# Patient Record
Sex: Female | Born: 1940 | Race: White | Hispanic: No | Marital: Married | Smoking: Current every day smoker
Health system: Southern US, Community
[De-identification: ages and names within clinical notes are randomized; demographics above are authoritative.]

## PROBLEM LIST (undated history)

## (undated) DIAGNOSIS — I6529 Occlusion and stenosis of unspecified carotid artery: Secondary | ICD-10-CM

## (undated) DIAGNOSIS — K219 Gastro-esophageal reflux disease without esophagitis: Secondary | ICD-10-CM

## (undated) DIAGNOSIS — E785 Hyperlipidemia, unspecified: Secondary | ICD-10-CM

## (undated) DIAGNOSIS — G4700 Insomnia, unspecified: Secondary | ICD-10-CM

## (undated) DIAGNOSIS — F329 Major depressive disorder, single episode, unspecified: Secondary | ICD-10-CM

## (undated) DIAGNOSIS — Z8673 Personal history of transient ischemic attack (TIA), and cerebral infarction without residual deficits: Secondary | ICD-10-CM

## (undated) DIAGNOSIS — I10 Essential (primary) hypertension: Secondary | ICD-10-CM

## (undated) DIAGNOSIS — F32A Depression, unspecified: Secondary | ICD-10-CM

## (undated) DIAGNOSIS — I639 Cerebral infarction, unspecified: Secondary | ICD-10-CM

## (undated) HISTORY — PX: BACK SURGERY: SHX140

---

## 1975-05-07 HISTORY — PX: ABDOMINAL HYSTERECTOMY: SHX81

## 1995-05-07 HISTORY — PX: SHOULDER SURGERY: SHX246

## 2006-09-24 ENCOUNTER — Ambulatory Visit: Payer: Self-pay | Admitting: Internal Medicine

## 2007-04-10 ENCOUNTER — Ambulatory Visit: Payer: Self-pay | Admitting: Internal Medicine

## 2007-04-16 ENCOUNTER — Ambulatory Visit: Payer: Self-pay | Admitting: Internal Medicine

## 2011-05-11 ENCOUNTER — Ambulatory Visit: Payer: Self-pay

## 2013-05-06 HISTORY — PX: CAROTID ARTERY ANGIOPLASTY: SHX1300

## 2014-05-22 ENCOUNTER — Ambulatory Visit: Payer: Self-pay | Admitting: Physician Assistant

## 2015-01-07 ENCOUNTER — Ambulatory Visit: Payer: Medicare Other

## 2015-01-07 ENCOUNTER — Ambulatory Visit
Admission: EM | Admit: 2015-01-07 | Discharge: 2015-01-07 | Disposition: A | Discharge status: Home / Self Care | Payer: Medicare Other | Attending: Internal Medicine | Admitting: Internal Medicine

## 2015-01-07 DIAGNOSIS — E785 Hyperlipidemia, unspecified: Secondary | ICD-10-CM | POA: Insufficient documentation

## 2015-01-07 DIAGNOSIS — F17210 Nicotine dependence, cigarettes, uncomplicated: Secondary | ICD-10-CM | POA: Diagnosis not present

## 2015-01-07 DIAGNOSIS — Y92099 Unspecified place in other non-institutional residence as the place of occurrence of the external cause: Secondary | ICD-10-CM

## 2015-01-07 DIAGNOSIS — Y92009 Unspecified place in unspecified non-institutional (private) residence as the place of occurrence of the external cause: Secondary | ICD-10-CM | POA: Diagnosis not present

## 2015-01-07 DIAGNOSIS — W07XXXA Fall from chair, initial encounter: Secondary | ICD-10-CM | POA: Insufficient documentation

## 2015-01-07 DIAGNOSIS — M545 Low back pain: Secondary | ICD-10-CM | POA: Insufficient documentation

## 2015-01-07 DIAGNOSIS — R1030 Lower abdominal pain, unspecified: Secondary | ICD-10-CM | POA: Diagnosis present

## 2015-01-07 DIAGNOSIS — M25551 Pain in right hip: Secondary | ICD-10-CM | POA: Insufficient documentation

## 2015-01-07 DIAGNOSIS — G8911 Acute pain due to trauma: Secondary | ICD-10-CM | POA: Diagnosis not present

## 2015-01-07 DIAGNOSIS — Z79899 Other long term (current) drug therapy: Secondary | ICD-10-CM | POA: Diagnosis not present

## 2015-01-07 DIAGNOSIS — G4700 Insomnia, unspecified: Secondary | ICD-10-CM | POA: Diagnosis not present

## 2015-01-07 DIAGNOSIS — Z8673 Personal history of transient ischemic attack (TIA), and cerebral infarction without residual deficits: Secondary | ICD-10-CM | POA: Insufficient documentation

## 2015-01-07 DIAGNOSIS — Z7982 Long term (current) use of aspirin: Secondary | ICD-10-CM | POA: Insufficient documentation

## 2015-01-07 DIAGNOSIS — I6529 Occlusion and stenosis of unspecified carotid artery: Secondary | ICD-10-CM | POA: Insufficient documentation

## 2015-01-07 DIAGNOSIS — F329 Major depressive disorder, single episode, unspecified: Secondary | ICD-10-CM | POA: Insufficient documentation

## 2015-01-07 DIAGNOSIS — M25561 Pain in right knee: Secondary | ICD-10-CM | POA: Diagnosis present

## 2015-01-07 DIAGNOSIS — W19XXXA Unspecified fall, initial encounter: Secondary | ICD-10-CM

## 2015-01-07 DIAGNOSIS — I10 Essential (primary) hypertension: Secondary | ICD-10-CM | POA: Diagnosis not present

## 2015-01-07 DIAGNOSIS — K219 Gastro-esophageal reflux disease without esophagitis: Secondary | ICD-10-CM | POA: Insufficient documentation

## 2015-01-07 HISTORY — DX: Major depressive disorder, single episode, unspecified: F32.9

## 2015-01-07 HISTORY — DX: Essential (primary) hypertension: I10

## 2015-01-07 HISTORY — DX: Gastro-esophageal reflux disease without esophagitis: K21.9

## 2015-01-07 HISTORY — DX: Insomnia, unspecified: G47.00

## 2015-01-07 HISTORY — DX: Personal history of transient ischemic attack (TIA), and cerebral infarction without residual deficits: Z86.73

## 2015-01-07 HISTORY — DX: Occlusion and stenosis of unspecified carotid artery: I65.29

## 2015-01-07 HISTORY — DX: Hyperlipidemia, unspecified: E78.5

## 2015-01-07 HISTORY — DX: Depression, unspecified: F32.A

## 2015-01-07 NOTE — Discharge Instructions (Signed)
xrays of your neck, low back, pelvis, and right hip were done at the urgent care today, and were all without acute findings (nothing broken or displaced). We did not xray your knee because it moves well, and is not swollen or bruised, just sore, so the likelihood of a broken bone is very very low. Use ice on the achey places and take tylenol or aleve to help with discomfort. Anticipate gradual improvement over the next couple weeks.
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
## 2015-01-07 NOTE — ED Provider Notes (Signed)
CSN: [id number]     Arrival date & time 01/07/15  1303 History   First MD Initiated Contact with Patient 01/07/15 1350     Chief Complaint  Patient presents with  . Knee Pain  . Groin Pain  . Fall   HPI  Patient is a 74 year old lady who reports that she dozed off in a kitchen chair last evening, and slid out of the chair to the right, landing on her right side. She was able to walk into the urgent care today with the help of crutches, but is having pain in her right knee, right groin, low back. Mild discomfort in her posterior neck. She reports a history of osteoporosis, and says that she fractures easily.  Past Medical History  Diagnosis Date  . History of CVA (cerebrovascular accident)   . Carotid artery stenosis   . GERD (gastroesophageal reflux disease)   . Hyperlipidemia   . Hypertension   . Depression   . Insomnia    Past Surgical History  Procedure Laterality Date  . Back surgery    . Carotid artery angioplasty Left 2015  . Shoulder surgery Right 1997  . Abdominal hysterectomy  1977   Family History  Problem Relation Age of Onset  . Lung cancer Father   . Uterine cancer Mother    Social History  Substance Use Topics  . Smoking status: Current Every Day Smoker -- 0.50 packs/day for 40 years    Types: Cigarettes  . Smokeless tobacco: None  . Alcohol Use: No    Review of Systems  All other systems reviewed and are negative.   Allergies  Review of patient's allergies indicates no known allergies.  Home Medications   Prior to Admission medications   Medication Sig Start Date End Date Taking? Authorizing Provider  amLODipine (NORVASC) 2.5 MG tablet Take 2.5 mg by mouth daily.   Yes Historical Provider, MD  aspirin 81 MG tablet Take 81 mg by mouth daily.   Yes Historical Provider, MD  calcium carbonate (OS-CAL) 600 MG TABS tablet Take 600 mg by mouth 2 (two) times daily with a meal.   Yes Historical Provider, MD  citalopram (CELEXA) 20 MG tablet Take 20 mg by  mouth daily.   Yes Historical Provider, MD  clopidogrel (PLAVIX) 75 MG tablet Take 75 mg by mouth daily.   Yes Historical Provider, MD  cycloSPORINE (RESTASIS) 0.05 % ophthalmic emulsion 1 drop 2 (two) times daily.   Yes Historical Provider, MD  gabapentin (NEURONTIN) 300 MG capsule Take 300 mg by mouth 3 (three) times daily.   Yes Historical Provider, MD  lansoprazole (PREVACID) 30 MG capsule Take 30 mg by mouth daily at 12 noon.   Yes Historical Provider, MD  latanoprost (XALATAN) 0.005 % ophthalmic solution 1 drop at bedtime.   Yes Historical Provider, MD  lisinopril (PRINIVIL,ZESTRIL) 40 MG tablet Take 40 mg by mouth daily.   Yes Historical Provider, MD  rosuvastatin (CRESTOR) 10 MG tablet Take 10 mg by mouth daily.   Yes Historical Provider, MD  triazolam (HALCION) 0.125 MG tablet Take 0.125 mg by mouth at bedtime as needed for sleep.   Yes Historical Provider, MD     BP 129/60 mmHg  Pulse 66  Temp(Src) 97.3 F (36.3 C) (Tympanic)  Resp 17  Ht 5\' 2"  (1.575 m)  Wt 138 lb (62.596 kg)  BMI 25.23 kg/m2  SpO2 100%  LMP  (Approximate)   Physical Exam  Constitutional: She is oriented to person, place, and time.  No distress.  Alert, nicely groomed Sitting on the side of the stretcher  HENT:  Head: Atraumatic.  Bilateral TMs are translucent, no hemotympanum, no retro-auricular bruising. No focal bruising or swelling of the face. No apparent injuries of lips, tongue.  Eyes:  Conjugate gaze, no eye redness/drainage  Neck:  Turning head freely  Cardiovascular: Normal rate.   Pulmonary/Chest: No respiratory distress.  Lungs clear, symmetric breath sounds  Abdominal: She exhibits no distension.  Musculoskeletal: Normal range of motion.  No leg swelling Knees are symmetric, no apparent effusion. Flexes knee and hip easily on the right and on the left. Sitting upright on stretcher unassisted No focal percussion tenderness of the midline posterior spine or neck.  Neurological: She is  alert and oriented to person, place, and time.  PERRLA, EOMI. Tongue is midline. Speech is clear/coherent.  Skin: Skin is warm and dry.  Pink. No cyanosis Patient is wearing shorts and a short sleeve shirt, and no abrasions or bruising are seen on exposed skin.  Nursing note and vitals reviewed.   ED Course  Procedures   Imaging Review Dg Cervical Spine Complete  01/07/2015   CLINICAL DATA:  Fell sleep in the chair last night. Patient's lead of a chair and landed on the right hip and hit the right side of her head. Sore neck.  EXAM: CERVICAL SPINE  4+ VIEWS  COMPARISON:  None.  FINDINGS: There is moderate degenerative change in the midcervical spine, notably at C3-4, C4-5, C5-6, and C6-7. There is loss of cervical lordosis, likely related to degenerative changes. No evidence for acute fracture or traumatic subluxation. Prevertebral soft tissues and lung apices are clear.  IMPRESSION: 1. Degenerative changes. 2.  No evidence for acute  abnormality.   Electronically Signed   By: Norva Pavlov M.D.   On: 01/07/2015 15:11   Dg Lumbar Spine Complete  01/07/2015   CLINICAL DATA:  Status post fall.  Right groin pain.  EXAM: LUMBAR SPINE - COMPLETE 4+ VIEW  COMPARISON:  None.  FINDINGS: There is no evidence of lumbar spine fracture. Alignment is normal. There is severe degenerative disc disease at L1-2. There is mild degenerative disc disease at L4-5 and L5-S1. There is mild bilateral facet arthropathy at L5-S1.  IMPRESSION: No acute osseous injury of the lumbar spine.   Electronically Signed   By: Elige Ko   On: 01/07/2015 15:14   Dg Pelvis 1-2 Views  01/07/2015   CLINICAL DATA:  Fall last night with right pelvic pain. Initial encounter.  EXAM: PELVIS - 1-2 VIEW  COMPARISON:  None.  FINDINGS: There is no evidence of pelvic fracture or diastasis. No pelvic bone lesions are seen.  IMPRESSION: Negative.   Electronically Signed   By: Harmon Pier M.D.   On: 01/07/2015 15:10      MDM   1. Fall at  home, initial encounter   2. Hip pain, right   3. Acute low back pain due to trauma    Anticipate gradual improvement over the next few weeks.  Recheck if not improving as expected. Will take motrin as needed.  Use ice for the next 48 hrs, then heat or ice as needed to help with pain.    Eustace Moore, MD 01/07/15 2013

## 2016-05-27 ENCOUNTER — Encounter: Payer: Self-pay | Admitting: *Deleted

## 2016-05-27 ENCOUNTER — Ambulatory Visit
Admission: EM | Admit: 2016-05-27 | Discharge: 2016-05-27 | Disposition: A | Discharge status: Home / Self Care | Payer: Medicare Other | Attending: Family Medicine | Admitting: Family Medicine

## 2016-05-27 DIAGNOSIS — R55 Syncope and collapse: Secondary | ICD-10-CM | POA: Insufficient documentation

## 2016-05-27 DIAGNOSIS — G4700 Insomnia, unspecified: Secondary | ICD-10-CM | POA: Insufficient documentation

## 2016-05-27 DIAGNOSIS — Z8673 Personal history of transient ischemic attack (TIA), and cerebral infarction without residual deficits: Secondary | ICD-10-CM | POA: Diagnosis not present

## 2016-05-27 DIAGNOSIS — Z808 Family history of malignant neoplasm of other organs or systems: Secondary | ICD-10-CM | POA: Diagnosis not present

## 2016-05-27 DIAGNOSIS — H269 Unspecified cataract: Secondary | ICD-10-CM | POA: Diagnosis not present

## 2016-05-27 DIAGNOSIS — F329 Major depressive disorder, single episode, unspecified: Secondary | ICD-10-CM | POA: Diagnosis not present

## 2016-05-27 DIAGNOSIS — Z7982 Long term (current) use of aspirin: Secondary | ICD-10-CM | POA: Insufficient documentation

## 2016-05-27 DIAGNOSIS — H3530 Unspecified macular degeneration: Secondary | ICD-10-CM | POA: Diagnosis not present

## 2016-05-27 DIAGNOSIS — T300 Burn of unspecified body region, unspecified degree: Secondary | ICD-10-CM | POA: Insufficient documentation

## 2016-05-27 DIAGNOSIS — K219 Gastro-esophageal reflux disease without esophagitis: Secondary | ICD-10-CM | POA: Diagnosis not present

## 2016-05-27 DIAGNOSIS — T2100XA Burn of unspecified degree of trunk, unspecified site, initial encounter: Secondary | ICD-10-CM | POA: Diagnosis not present

## 2016-05-27 DIAGNOSIS — I10 Essential (primary) hypertension: Secondary | ICD-10-CM | POA: Diagnosis not present

## 2016-05-27 DIAGNOSIS — E785 Hyperlipidemia, unspecified: Secondary | ICD-10-CM | POA: Insufficient documentation

## 2016-05-27 DIAGNOSIS — Z801 Family history of malignant neoplasm of trachea, bronchus and lung: Secondary | ICD-10-CM | POA: Insufficient documentation

## 2016-05-27 DIAGNOSIS — X100XXD Contact with hot drinks, subsequent encounter: Secondary | ICD-10-CM | POA: Diagnosis not present

## 2016-05-27 DIAGNOSIS — F17210 Nicotine dependence, cigarettes, uncomplicated: Secondary | ICD-10-CM | POA: Diagnosis not present

## 2016-05-27 DIAGNOSIS — Z9889 Other specified postprocedural states: Secondary | ICD-10-CM | POA: Diagnosis not present

## 2016-05-27 DIAGNOSIS — R51 Headache: Secondary | ICD-10-CM | POA: Diagnosis not present

## 2016-05-27 DIAGNOSIS — H409 Unspecified glaucoma: Secondary | ICD-10-CM | POA: Insufficient documentation

## 2016-05-27 DIAGNOSIS — Z79899 Other long term (current) drug therapy: Secondary | ICD-10-CM | POA: Diagnosis not present

## 2016-05-27 LAB — BASIC METABOLIC PANEL
ANION GAP: 9 (ref 5–15)
BUN: 17 mg/dL (ref 6–20)
CALCIUM: 9 mg/dL (ref 8.9–10.3)
CO2: 24 mmol/L (ref 22–32)
Chloride: 105 mmol/L (ref 101–111)
Creatinine, Ser: 0.99 mg/dL (ref 0.44–1.00)
GFR calc Af Amer: >60 mL/min (ref >60)
GFR, EST NON AFRICAN AMERICAN: 54 mL/min — AB (ref >60)
GLUCOSE: 94 mg/dL (ref 65–99)
Potassium: 4.4 mmol/L (ref 3.5–5.1)
Sodium: 138 mmol/L (ref 135–145)

## 2016-05-27 LAB — CBC WITH DIFFERENTIAL/PLATELET
BASOS ABS: 0.1 10*3/uL (ref 0–0.1)
BASOS PCT: 1 %
EOS PCT: 4 %
Eosinophils Absolute: 0.4 10*3/uL (ref 0–0.7)
HEMATOCRIT: 42.3 % (ref 35.0–47.0)
Hemoglobin: 14.3 g/dL (ref 12.0–16.0)
Lymphocytes Relative: 22 %
Lymphs Abs: 2.1 10*3/uL (ref 1.0–3.6)
MCH: 30.6 pg (ref 26.0–34.0)
MCHC: 33.9 g/dL (ref 32.0–36.0)
MCV: 90.1 fL (ref 80.0–100.0)
MONO ABS: 0.7 10*3/uL (ref 0.2–0.9)
MONOS PCT: 8 %
NEUTROS ABS: 6.1 10*3/uL (ref 1.4–6.5)
Neutrophils Relative %: 65 %
PLATELETS: 225 10*3/uL (ref 150–440)
RBC: 4.69 MIL/uL (ref 3.80–5.20)
RDW: 13.6 % (ref 11.5–14.5)
WBC: 9.3 10*3/uL (ref 3.6–11.0)

## 2016-05-27 MED ORDER — TETANUS-DIPHTH-ACELL PERTUSSIS 5-2.5-18.5 LF-MCG/0.5 IM SUSP
0.5000 mL | Freq: Once | INTRAMUSCULAR | Status: AC
  Administered 2016-05-27: 0.5 mL via INTRAMUSCULAR

## 2016-05-27 MED ORDER — SILVER SULFADIAZINE 1 % EX CREA
TOPICAL_CREAM | Freq: Once | CUTANEOUS | Status: AC
  Administered 2016-05-27: 17:00:00 via TOPICAL

## 2016-05-27 MED ORDER — CEPHALEXIN 500 MG PO CAPS: 500.0000 mg | ORAL_CAPSULE | Freq: Four times a day (QID) | ORAL | 0 refills | Status: AC

## 2016-05-27 MED ORDER — SILVER SULFADIAZINE 1 % EX CREA: 1.0000 "application " | TOPICAL_CREAM | Freq: Every day | CUTANEOUS | 0 refills | Status: AC
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
## 2017-12-19 ENCOUNTER — Ambulatory Visit
Admission: EM | Admit: 2017-12-19 | Discharge: 2017-12-19 | Disposition: A | Discharge status: Home / Self Care | Payer: Medicare Other | Attending: Family Medicine | Admitting: Family Medicine

## 2017-12-19 ENCOUNTER — Ambulatory Visit: Payer: Medicare Other

## 2017-12-19 ENCOUNTER — Other Ambulatory Visit: Payer: Self-pay

## 2017-12-19 DIAGNOSIS — F329 Major depressive disorder, single episode, unspecified: Secondary | ICD-10-CM | POA: Diagnosis not present

## 2017-12-19 DIAGNOSIS — K219 Gastro-esophageal reflux disease without esophagitis: Secondary | ICD-10-CM | POA: Insufficient documentation

## 2017-12-19 DIAGNOSIS — I10 Essential (primary) hypertension: Secondary | ICD-10-CM | POA: Diagnosis not present

## 2017-12-19 DIAGNOSIS — E785 Hyperlipidemia, unspecified: Secondary | ICD-10-CM | POA: Diagnosis not present

## 2017-12-19 DIAGNOSIS — Z7982 Long term (current) use of aspirin: Secondary | ICD-10-CM | POA: Insufficient documentation

## 2017-12-19 DIAGNOSIS — Z885 Allergy status to narcotic agent status: Secondary | ICD-10-CM | POA: Insufficient documentation

## 2017-12-19 DIAGNOSIS — Z8673 Personal history of transient ischemic attack (TIA), and cerebral infarction without residual deficits: Secondary | ICD-10-CM | POA: Insufficient documentation

## 2017-12-19 DIAGNOSIS — S2242XA Multiple fractures of ribs, left side, initial encounter for closed fracture: Secondary | ICD-10-CM | POA: Diagnosis not present

## 2017-12-19 DIAGNOSIS — F17210 Nicotine dependence, cigarettes, uncomplicated: Secondary | ICD-10-CM | POA: Diagnosis not present

## 2017-12-19 DIAGNOSIS — Z79899 Other long term (current) drug therapy: Secondary | ICD-10-CM | POA: Insufficient documentation

## 2017-12-19 DIAGNOSIS — W010XXA Fall on same level from slipping, tripping and stumbling without subsequent striking against object, initial encounter: Secondary | ICD-10-CM | POA: Insufficient documentation

## 2017-12-19 DIAGNOSIS — Z7902 Long term (current) use of antithrombotics/antiplatelets: Secondary | ICD-10-CM | POA: Insufficient documentation

## 2017-12-19 DIAGNOSIS — G4700 Insomnia, unspecified: Secondary | ICD-10-CM | POA: Insufficient documentation

## 2017-12-19 DIAGNOSIS — R0781 Pleurodynia: Secondary | ICD-10-CM | POA: Diagnosis present

## 2017-12-19 MED ORDER — OXYCODONE-ACETAMINOPHEN 5-325 MG PO TABS: ORAL_TABLET | ORAL | 0 refills | Status: DC

## 2017-12-19 NOTE — ED Provider Notes (Signed)
[facility]    CSN: [id number] Arrival date & time: 12/19/17  1407     History   Chief Complaint Chief Complaint  Patient presents with  . Rib Pain  . Fall    HPI Ruth Ramirez is a 77 y.o. female.   77 yo female with a c/o left sided rib pain after falling yesterday at home on her deck. States she tripped over something and landed on her left side. Denies hitting her head or loss of consciousness.   The history is provided by the patient.  Fall     Past Medical History:  Diagnosis Date  . Carotid artery stenosis   . Depression   . GERD (gastroesophageal reflux disease)   . History of CVA (cerebrovascular accident)   . Hyperlipidemia   . Hypertension   . Insomnia     There are no active problems to display for this patient.   Past Surgical History:  Procedure Laterality Date  . ABDOMINAL HYSTERECTOMY  1977  . BACK SURGERY    . CAROTID ARTERY ANGIOPLASTY Left 2015  . SHOULDER SURGERY Right 1997    OB History   None      Home Medications    Prior to Admission medications   Medication Sig Start Date End Date Taking? Authorizing Provider  aspirin 81 MG tablet Take 81 mg by mouth daily.   Yes [provider]  calcium carbonate (OS-CAL) 600 MG TABS tablet Take 600 mg by mouth 2 (two) times daily with a meal.   Yes [provider]  citalopram (CELEXA) 20 MG tablet Take 20 mg by mouth daily.   Yes [provider]  clopidogrel (PLAVIX) 75 MG tablet Take 75 mg by mouth daily.   Yes [provider]  cyanocobalamin ([employer] VITAMIN B12) 2000 MCG tablet Take by mouth.   Yes [provider]  cycloSPORINE (RESTASIS) 0.05 % ophthalmic emulsion 1 drop 2 (two) times daily.   Yes [provider]  gabapentin (NEURONTIN) 300 MG capsule Take 300 mg by mouth 3 (three) times daily.   Yes [provider]  hydrochlorothiazide (HYDRODIURIL) 12.5 MG tablet TAKE 1 TABLET BY MOUTH DAILY AS NEEDED FOR HIGH  BLOOD PRESSURE OR SWELLING 10/03/17  Yes [provider]  lansoprazole (PREVACID) 30 MG capsule Take 30 mg by mouth daily at 12 noon.   Yes [provider]  latanoprost (XALATAN) 0.005 % ophthalmic solution 1 drop at bedtime.   Yes [provider]  lisinopril (PRINIVIL,ZESTRIL) 40 MG tablet Take 40 mg by mouth daily.   Yes [provider]  triazolam (HALCION) 0.125 MG tablet Take 0.125 mg by mouth at bedtime as needed for sleep.   Yes [provider]  amLODipine (NORVASC) 2.5 MG tablet Take 2.5 mg by mouth daily.    [provider]  oxyCODONE-acetaminophen (PERCOCET/ROXICET) 5-325 MG tablet 1-2 tablets q 8 hours prn severe pain 12/19/17   Payton Mccallum, MD  rosuvastatin (CRESTOR) 10 MG tablet Take 10 mg by mouth daily.    [provider]    Family History Family History  Problem Relation Age of Onset  . Uterine cancer Mother   . Lung cancer Father     Social History Social History   Tobacco Use  . Smoking status: Current Every Day Smoker    Packs/day: 0.50    Years: 40.00    Pack years: 20.00    Types: Cigarettes  . Smokeless tobacco: Never Used  Substance Use Topics  .  Alcohol use: No    Alcohol/week: 0.0 standard drinks  . Drug use: No     Allergies   Morphine and related   Review of Systems Review of Systems   Physical Exam Triage Vital Signs ED Triage Vitals  Enc Vitals Group     BP 12/19/17 1424 139/62     Pulse Rate 12/19/17 1424 66     Resp 12/19/17 1424 16     Temp 12/19/17 1424 97.6 F (36.4 C)     Temp Source 12/19/17 1424 Oral     SpO2 12/19/17 1424 98 %     Weight 12/19/17 1421 134 lb (60.8 kg)     Height 12/19/17 1421 5\' 2"  (1.575 m)     Head Circumference --      Peak Flow --      Pain Score 12/19/17 1421 10     Pain Loc --      Pain Edu? --      Excl. in [place]? --    No data found.  Updated Vital Signs BP 139/62 (BP Location: Left Arm)   Pulse 66   Temp 97.6 F (36.4 C) (Oral)    Resp 16   Ht 5\' 2"  (1.575 m)   Wt 60.8 kg   SpO2 98%   BMI 24.51 kg/m   Visual Acuity Right Eye Distance:   Left Eye Distance:   Bilateral Distance:    Right Eye Near:   Left Eye Near:    Bilateral Near:     Physical Exam  Constitutional: She appears well-developed and well-nourished. No distress.  HENT:  Head: Normocephalic and atraumatic.  Cardiovascular: Normal rate, regular rhythm, normal heart sounds and intact distal pulses.  Pulmonary/Chest: Effort normal and breath sounds normal. No stridor. No respiratory distress. She has no wheezes. She has no rales. She exhibits tenderness.  Skin: She is not diaphoretic.  Vitals reviewed.    UC Treatments / Results  Labs (all labs ordered are listed, but only abnormal results are displayed) Labs Reviewed - No data to display  EKG None  Radiology Dg Ribs Unilateral W/chest Left  Result Date: 12/19/2017 CLINICAL DATA:  Left chest pain since a trip and fall last night. Initial encounter. EXAM: LEFT RIBS AND CHEST - 3+ VIEW COMPARISON:  PA and lateral chest 05/22/2014. FINDINGS: There is minimal left basilar atelectasis and a very small left effusion. The right lung is clear. No right effusion. No pneumothorax. Heart size is normal. Aortic atherosclerosis is seen. The patient has acute fractures of the left eighth, ninth and tenth ribs. The eighth and ninth rib fractures are mildly displaced. The tenth rib fracture is nondisplaced. IMPRESSION: Acute left eighth through tenth rib fractures with associated small effusion. Negative for pneumothorax. Atherosclerosis. Electronically Signed   By: Drusilla Kannerhomas  Dalessio M.D.   On: 12/19/2017 14:50    Procedures Procedures (including critical care time)  Medications Ordered in UC Medications - No data to display  Initial Impression / Assessment and Plan / UC Course  I have reviewed the triage vital signs and the nursing notes.  Pertinent labs & imaging results that were available during  my care of the patient were reviewed by me and considered in my medical decision making (see chart for details).    Final Clinical Impressions(s) / UC Diagnoses   Final diagnoses:  Closed fracture of multiple ribs of left side, initial encounter    ED Prescriptions    Medication Sig Dispense Auth. Provider   oxyCODONE-acetaminophen (PERCOCET/ROXICET)  5-325 MG tablet 1-2 tablets q 8 hours prn severe pain 15 tablet Cantrell Larouche, MD      1. x-ray results and diagnosis reviewed with patient 2. rx as per orders above; reviewed possible side effects, interactions, risks and benefits  3. Recommend supportive treatment with rest, ice 4. Follow-up prn if symptoms worsen or don't improve    Controlled Substance Prescriptions [place] Controlled Substance Registry consulted? Not Applicable   Payton Mccallumonty, Maimouna Rondeau, MD 12/19/17 [phone]

## 2017-12-19 NOTE — ED Triage Notes (Signed)
Patient complains of a fall that occurred around 5pm last night. Patient states that she was on the deck and tripped over something. Patient states that she was knocked over on the deck and now has pain all on left ribs and radiates around to her back.

## 2018-04-17 ENCOUNTER — Other Ambulatory Visit: Payer: Self-pay | Admitting: Family Medicine

## 2018-04-17 DIAGNOSIS — Z1231 Encounter for screening mammogram for malignant neoplasm of breast: Secondary | ICD-10-CM

## 2018-12-15 ENCOUNTER — Other Ambulatory Visit: Payer: Self-pay

## 2018-12-15 ENCOUNTER — Inpatient Hospital Stay
Admission: EM | Admit: 2018-12-15 | Discharge: 2018-12-19 | DRG: 640 | Disposition: A | Discharge status: Home / Self Care | Payer: Medicare Other | Attending: Internal Medicine | Admitting: Internal Medicine

## 2018-12-15 ENCOUNTER — Encounter: Payer: Self-pay | Admitting: Emergency Medicine

## 2018-12-15 ENCOUNTER — Emergency Department: Payer: Medicare Other

## 2018-12-15 DIAGNOSIS — Z7982 Long term (current) use of aspirin: Secondary | ICD-10-CM | POA: Diagnosis not present

## 2018-12-15 DIAGNOSIS — Z885 Allergy status to narcotic agent status: Secondary | ICD-10-CM

## 2018-12-15 DIAGNOSIS — F17210 Nicotine dependence, cigarettes, uncomplicated: Secondary | ICD-10-CM | POA: Diagnosis present

## 2018-12-15 DIAGNOSIS — R519 Headache, unspecified: Secondary | ICD-10-CM

## 2018-12-15 DIAGNOSIS — M810 Age-related osteoporosis without current pathological fracture: Secondary | ICD-10-CM | POA: Diagnosis present

## 2018-12-15 DIAGNOSIS — Z801 Family history of malignant neoplasm of trachea, bronchus and lung: Secondary | ICD-10-CM

## 2018-12-15 DIAGNOSIS — R51 Headache: Secondary | ICD-10-CM | POA: Diagnosis present

## 2018-12-15 DIAGNOSIS — F329 Major depressive disorder, single episode, unspecified: Secondary | ICD-10-CM | POA: Diagnosis present

## 2018-12-15 DIAGNOSIS — E785 Hyperlipidemia, unspecified: Secondary | ICD-10-CM | POA: Diagnosis present

## 2018-12-15 DIAGNOSIS — K219 Gastro-esophageal reflux disease without esophagitis: Secondary | ICD-10-CM | POA: Diagnosis present

## 2018-12-15 DIAGNOSIS — Z20828 Contact with and (suspected) exposure to other viral communicable diseases: Secondary | ICD-10-CM | POA: Diagnosis present

## 2018-12-15 DIAGNOSIS — Z79899 Other long term (current) drug therapy: Secondary | ICD-10-CM

## 2018-12-15 DIAGNOSIS — R911 Solitary pulmonary nodule: Secondary | ICD-10-CM | POA: Diagnosis present

## 2018-12-15 DIAGNOSIS — E041 Nontoxic single thyroid nodule: Secondary | ICD-10-CM | POA: Diagnosis present

## 2018-12-15 DIAGNOSIS — Z7902 Long term (current) use of antithrombotics/antiplatelets: Secondary | ICD-10-CM

## 2018-12-15 DIAGNOSIS — Z8673 Personal history of transient ischemic attack (TIA), and cerebral infarction without residual deficits: Secondary | ICD-10-CM | POA: Diagnosis not present

## 2018-12-15 DIAGNOSIS — E871 Hypo-osmolality and hyponatremia: Secondary | ICD-10-CM | POA: Diagnosis present

## 2018-12-15 DIAGNOSIS — E878 Other disorders of electrolyte and fluid balance, not elsewhere classified: Secondary | ICD-10-CM | POA: Diagnosis present

## 2018-12-15 DIAGNOSIS — M8448XA Pathological fracture, other site, initial encounter for fracture: Secondary | ICD-10-CM | POA: Diagnosis present

## 2018-12-15 DIAGNOSIS — G9341 Metabolic encephalopathy: Secondary | ICD-10-CM | POA: Diagnosis present

## 2018-12-15 DIAGNOSIS — I10 Essential (primary) hypertension: Secondary | ICD-10-CM | POA: Diagnosis present

## 2018-12-15 DIAGNOSIS — Z8049 Family history of malignant neoplasm of other genital organs: Secondary | ICD-10-CM

## 2018-12-15 DIAGNOSIS — I6529 Occlusion and stenosis of unspecified carotid artery: Secondary | ICD-10-CM | POA: Diagnosis present

## 2018-12-15 DIAGNOSIS — R531 Weakness: Secondary | ICD-10-CM | POA: Diagnosis not present

## 2018-12-15 DIAGNOSIS — I2510 Atherosclerotic heart disease of native coronary artery without angina pectoris: Secondary | ICD-10-CM | POA: Diagnosis present

## 2018-12-15 DIAGNOSIS — G629 Polyneuropathy, unspecified: Secondary | ICD-10-CM | POA: Diagnosis present

## 2018-12-15 DIAGNOSIS — S42302A Unspecified fracture of shaft of humerus, left arm, initial encounter for closed fracture: Secondary | ICD-10-CM

## 2018-12-15 DIAGNOSIS — G4700 Insomnia, unspecified: Secondary | ICD-10-CM | POA: Diagnosis present

## 2018-12-15 DIAGNOSIS — R410 Disorientation, unspecified: Secondary | ICD-10-CM

## 2018-12-15 LAB — CBC
HCT: 40.6 % (ref 36.0–46.0)
Hemoglobin: 14.8 g/dL (ref 12.0–15.0)
MCH: 29.7 pg (ref 26.0–34.0)
MCHC: 36.5 g/dL — ABNORMAL HIGH (ref 30.0–36.0)
MCV: 81.5 fL (ref 80.0–100.0)
Platelets: 334 10*3/uL (ref 150–400)
RBC: 4.98 MIL/uL (ref 3.87–5.11)
RDW: 13.3 % (ref 11.5–15.5)
WBC: 6.7 10*3/uL (ref 4.0–10.5)
nRBC: 0 % (ref 0.0–0.2)

## 2018-12-15 LAB — URINALYSIS, COMPLETE (UACMP) WITH MICROSCOPIC
Bilirubin Urine: NEGATIVE
Glucose, UA: NEGATIVE mg/dL
Hgb urine dipstick: NEGATIVE
Ketones, ur: NEGATIVE mg/dL
Nitrite: NEGATIVE
Protein, ur: 100 mg/dL — AB
Specific Gravity, Urine: 1.012 (ref 1.005–1.030)
pH: 7 (ref 5.0–8.0)

## 2018-12-15 LAB — COMPREHENSIVE METABOLIC PANEL
ALT: 14 U/L (ref 0–44)
AST: 28 U/L (ref 15–41)
Albumin: 4.5 g/dL (ref 3.5–5.0)
Alkaline Phosphatase: 106 U/L (ref 38–126)
Anion gap: 14 (ref 5–15)
BUN: 9 mg/dL (ref 8–23)
CO2: 23 mmol/L (ref 22–32)
Calcium: 9.3 mg/dL (ref 8.9–10.3)
Chloride: 79 mmol/L — ABNORMAL LOW (ref 98–111)
Creatinine, Ser: 0.79 mg/dL (ref 0.44–1.00)
GFR calc Af Amer: >60 mL/min (ref >60)
GFR calc non Af Amer: >60 mL/min (ref >60)
Glucose, Bld: 139 mg/dL — ABNORMAL HIGH (ref 70–99)
Potassium: 4.3 mmol/L (ref 3.5–5.1)
Sodium: 116 mmol/L — CL (ref 135–145)
Total Bilirubin: 0.4 mg/dL (ref 0.3–1.2)
Total Protein: 7.9 g/dL (ref 6.5–8.1)

## 2018-12-15 LAB — MAGNESIUM: Magnesium: 1.6 mg/dL — ABNORMAL LOW (ref 1.7–2.4)

## 2018-12-15 LAB — OSMOLALITY: Osmolality: 249 mOsm/kg — CL (ref 275–295)

## 2018-12-15 LAB — TSH: TSH: 2.316 u[IU]/mL (ref 0.350–4.500)

## 2018-12-15 LAB — PHOSPHORUS: Phosphorus: 2.5 mg/dL (ref 2.5–4.6)

## 2018-12-15 LAB — SODIUM, URINE, RANDOM: Sodium, Ur: 117 mmol/L

## 2018-12-15 MED ORDER — MAGNESIUM SULFATE 2 GM/50ML IV SOLN
2.0000 g | Freq: Once | INTRAVENOUS | Status: AC
  Administered 2018-12-15: 2 g via INTRAVENOUS
  Filled 2018-12-15: qty 50

## 2018-12-15 MED ORDER — ACETAMINOPHEN 500 MG PO TABS
1000.0000 mg | ORAL_TABLET | Freq: Once | ORAL | Status: AC
  Administered 2018-12-15: 1000 mg via ORAL
  Filled 2018-12-15: qty 2

## 2018-12-15 MED ORDER — CLOPIDOGREL BISULFATE 75 MG PO TABS
75.0000 mg | ORAL_TABLET | Freq: Every day | ORAL | Status: DC
  Administered 2018-12-16 – 2018-12-19 (×4): 75 mg via ORAL
  Filled 2018-12-15 (×4): qty 1

## 2018-12-15 MED ORDER — CALCIUM CARBONATE-VITAMIN D 500-200 MG-UNIT PO TABS
1.0000 | ORAL_TABLET | Freq: Two times a day (BID) | ORAL | Status: DC
  Administered 2018-12-16 – 2018-12-19 (×7): 1 via ORAL
  Filled 2018-12-15 (×7): qty 1

## 2018-12-15 MED ORDER — SODIUM CHLORIDE 0.9 % IV SOLN
INTRAVENOUS | Status: DC
  Administered 2018-12-15 – 2018-12-16 (×2): via INTRAVENOUS

## 2018-12-15 MED ORDER — DOCUSATE SODIUM 100 MG PO CAPS: 100.0000 mg | ORAL_CAPSULE | Freq: Two times a day (BID) | ORAL | Status: DC | PRN

## 2018-12-15 MED ORDER — AMLODIPINE BESYLATE 5 MG PO TABS
2.5000 mg | ORAL_TABLET | Freq: Every day | ORAL | Status: DC
  Administered 2018-12-16 – 2018-12-19 (×4): 2.5 mg via ORAL
  Filled 2018-12-15 (×4): qty 1

## 2018-12-15 MED ORDER — PANTOPRAZOLE SODIUM 20 MG PO TBEC
20.0000 mg | DELAYED_RELEASE_TABLET | Freq: Every day | ORAL | Status: DC
  Administered 2018-12-16: 20 mg via ORAL
  Filled 2018-12-15: qty 1

## 2018-12-15 MED ORDER — GABAPENTIN 300 MG PO CAPS
300.0000 mg | ORAL_CAPSULE | Freq: Three times a day (TID) | ORAL | Status: DC
  Administered 2018-12-16 (×3): 300 mg via ORAL
  Filled 2018-12-15 (×3): qty 1

## 2018-12-15 MED ORDER — LISINOPRIL 20 MG PO TABS
40.0000 mg | ORAL_TABLET | Freq: Every day | ORAL | Status: DC
  Administered 2018-12-16 – 2018-12-19 (×4): 40 mg via ORAL
  Filled 2018-12-15 (×4): qty 2

## 2018-12-15 MED ORDER — VITAMIN B-12 1000 MCG PO TABS
2000.0000 ug | ORAL_TABLET | Freq: Every day | ORAL | Status: DC
  Administered 2018-12-16 – 2018-12-19 (×4): 2000 ug via ORAL
  Filled 2018-12-15 (×5): qty 2

## 2018-12-15 MED ORDER — HEPARIN SODIUM (PORCINE) 5000 UNIT/ML IJ SOLN
5000.0000 [IU] | Freq: Three times a day (TID) | INTRAMUSCULAR | Status: DC
  Administered 2018-12-16 – 2018-12-19 (×10): 5000 [IU] via SUBCUTANEOUS
  Filled 2018-12-15 (×10): qty 1

## 2018-12-15 MED ORDER — ASPIRIN EC 81 MG PO TBEC
81.0000 mg | DELAYED_RELEASE_TABLET | Freq: Every day | ORAL | Status: DC
  Administered 2018-12-16 – 2018-12-19 (×4): 81 mg via ORAL
  Filled 2018-12-15 (×4): qty 1

## 2018-12-15 MED ORDER — TRIAZOLAM 0.125 MG PO TABS
0.1250 mg | ORAL_TABLET | Freq: Every evening | ORAL | Status: DC | PRN
  Administered 2018-12-16 – 2018-12-18 (×4): 0.125 mg via ORAL
  Filled 2018-12-15 (×8): qty 1

## 2018-12-15 NOTE — ED Notes (Signed)
Date and time results received: 12/15/18 6:47 PM (use smartphrase ".now" to insert current time)  Test: Sodium Critical Value: 116  Name of Provider Notified: Dr Charna Archer  Orders Received? Or Actions Taken?: No new orders at this time

## 2018-12-15 NOTE — ED Triage Notes (Signed)
Patient sent over by [facility] due to hyponatremia. Patient has had worsening headaches and confusion for the last week per husband. Patient's sodium this morning 118. Patient alert to person and place in triage.

## 2018-12-15 NOTE — H&P (Addendum)
Ruth Ramirez NAME: Ruth Ramirez    MR#:  [id number]  DATE OF BIRTH:  07-Dec-1940  DATE OF ADMISSION:  12/15/2018  PRIMARY CARE PHYSICIAN: Sofie Hartigan, MD   REQUESTING/REFERRING PHYSICIAN: Charna Archer  CHIEF COMPLAINT:   Chief Complaint  Patient presents with  . Headache  . Abnormal Lab  . Altered Mental Status    HISTORY OF PRESENT ILLNESS: Ruth Ramirez  is a 78 y.o. female with a known history of carotid artery stenosis, depression, gastroesophageal reflux disease, history of CVA, hyperlipidemia, hypertension, insomnia-lives with husband and brought to emergency room by him has complain of worsening generalized weakness and some confusion for last 1 to 2 weeks.  As per husband she did not had any fever chills cough or shortness of breath.  She did not had any swelling or edema.  She did not had any vomiting or diarrhea She was noted to have hyponatremia in ER.  COVID-19 test is pending.  PAST MEDICAL HISTORY:   Past Medical History:  Diagnosis Date  . Carotid artery stenosis   . Depression   . GERD (gastroesophageal reflux disease)   . History of CVA (cerebrovascular accident)   . Hyperlipidemia   . Hypertension   . Insomnia     PAST SURGICAL HISTORY:  Past Surgical History:  Procedure Laterality Date  . ABDOMINAL HYSTERECTOMY  1977  . BACK SURGERY    . CAROTID ARTERY ANGIOPLASTY Left 2015  . SHOULDER SURGERY Right 1997    SOCIAL HISTORY:  Social History   Tobacco Use  . Smoking status: Current Every Day Smoker    Packs/day: 0.50    Years: 40.00    Pack years: 20.00    Types: Cigarettes  . Smokeless tobacco: Never Used  Substance Use Topics  . Alcohol use: No    Alcohol/week: 0.0 standard drinks    FAMILY HISTORY:  Family History  Problem Relation Age of Onset  . Uterine cancer Mother   . Lung cancer Father     DRUG ALLERGIES:  Allergies  Allergen Reactions  . Morphine And Related Anxiety     REVIEW OF SYSTEMS:   CONSTITUTIONAL: No fever,have fatigue or weakness.  EYES: No blurred or double vision.  EARS, NOSE, AND THROAT: No tinnitus or ear pain.  RESPIRATORY: No cough, shortness of breath, wheezing or hemoptysis.  CARDIOVASCULAR: No chest pain, orthopnea, edema.  GASTROINTESTINAL: No nausea, vomiting, diarrhea or abdominal pain.  GENITOURINARY: No dysuria, hematuria.  ENDOCRINE: No polyuria, nocturia,  HEMATOLOGY: No anemia, easy bruising or bleeding SKIN: No rash or lesion. MUSCULOSKELETAL: No joint pain or arthritis.   NEUROLOGIC: No tingling, numbness, weakness.  PSYCHIATRY: No anxiety or depression.   MEDICATIONS AT HOME:  Prior to Admission medications   Medication Sig Start Date End Date Taking? Authorizing Provider  amLODipine (NORVASC) 2.5 MG tablet Take 2.5 mg by mouth daily.   Yes [provider]  aspirin 81 MG tablet Take 81 mg by mouth daily.   Yes [provider]  calcium carbonate (OS-CAL) 600 MG TABS tablet Take 600 mg by mouth 2 (two) times daily with a meal.   Yes [provider]  clopidogrel (PLAVIX) 75 MG tablet Take 75 mg by mouth daily.   Yes [provider]  cyanocobalamin ([employer]S VITAMIN B12) 2000 MCG tablet Take by mouth.   Yes [provider]  gabapentin (NEURONTIN) 300 MG capsule Take 300 mg by mouth 3 (three) times daily.  Yes [provider]  hydrochlorothiazide (HYDRODIURIL) 12.5 MG tablet TAKE 1 TABLET BY MOUTH DAILY AS NEEDED FOR HIGH BLOOD PRESSURE OR SWELLING 10/03/17  Yes [provider]  lansoprazole (PREVACID) 30 MG capsule Take 30 mg by mouth daily at 12 noon.   Yes [provider]  lisinopril (PRINIVIL,ZESTRIL) 40 MG tablet Take 40 mg by mouth daily.   Yes [provider]  triazolam (HALCION) 0.125 MG tablet Take 0.125 mg by mouth at bedtime as needed for sleep. May take 1/2 extra as needed   Yes [provider]      PHYSICAL EXAMINATION:    VITAL SIGNS: Blood pressure (!) 167/67, pulse 82, temperature 98 F (36.7 C), temperature source Oral, resp. rate 13, height 5\' 2"  (1.575 m), weight 61.2 kg, SpO2 94 %.  GENERAL:  78 y.o.-year-old patient lying in the bed with no acute distress.  EYES: Pupils equal, round, reactive to light and accommodation. No scleral icterus. Extraocular muscles intact.  HEENT: Head atraumatic, normocephalic. Oropharynx and nasopharynx clear.  NECK:  Supple, no jugular venous distention. No thyroid enlargement, no tenderness.  LUNGS: Normal breath sounds bilaterally, no wheezing, rales,rhonchi or crepitation. No use of accessory muscles of respiration.  CARDIOVASCULAR: S1, S2 normal. No murmurs, rubs, or gallops.  ABDOMEN: Soft, nontender, nondistended. Bowel sounds present. No organomegaly or mass.  EXTREMITIES: No pedal edema, cyanosis, or clubbing.  NEUROLOGIC: Cranial nerves II through XII are intact. Muscle strength 4/5 in all extremities. Sensation intact. Gait not checked.  PSYCHIATRIC: The patient is alert and oriented x 2.  SKIN: No obvious rash, lesion, or ulcer.   LABORATORY PANEL:   CBC Recent Labs  Lab 12/15/18 1808  WBC 6.7  HGB 14.8  HCT 40.6  PLT 334  MCV 81.5  MCH 29.7  MCHC 36.5*  RDW 13.3   ------------------------------------------------------------------------------------------------------------------  Chemistries  Recent Labs  Lab 12/15/18 1808  NA 116*  K 4.3  CL 79*  CO2 23  GLUCOSE 139*  BUN 9  CREATININE 0.79  CALCIUM 9.3  MG 1.6*  AST 28  ALT 14  ALKPHOS 106  BILITOT 0.4   ------------------------------------------------------------------------------------------------------------------ estimated creatinine clearance is 48.3 mL/min (by C-G formula based on SCr of 0.79 mg/dL). ------------------------------------------------------------------------------------------------------------------ No results for input(s): TSH, T4TOTAL, T3FREE, THYROIDAB in  the last 72 hours.  Invalid input(s): FREET3   Coagulation profile No results for input(s): INR, PROTIME in the last 168 hours. ------------------------------------------------------------------------------------------------------------------- No results for input(s): DDIMER in the last 72 hours. -------------------------------------------------------------------------------------------------------------------  Cardiac Enzymes No results for input(s): CKMB, TROPONINI, MYOGLOBIN in the last 168 hours.  Invalid input(s): CK ------------------------------------------------------------------------------------------------------------------ Invalid input(s): POCBNP  ---------------------------------------------------------------------------------------------------------------  Urinalysis    Component Value Date/Time   COLORURINE YELLOW (A) 12/15/2018 1834   APPEARANCEUR CLEAR (A) 12/15/2018 1834   LABSPEC 1.012 12/15/2018 1834   PHURINE 7.0 12/15/2018 1834   GLUCOSEU NEGATIVE 12/15/2018 1834   HGBUR NEGATIVE 12/15/2018 1834   BILIRUBINUR NEGATIVE 12/15/2018 1834   KETONESUR NEGATIVE 12/15/2018 1834   PROTEINUR 100 (A) 12/15/2018 1834   NITRITE NEGATIVE 12/15/2018 1834   LEUKOCYTESUR TRACE (A) 12/15/2018 1834     RADIOLOGY: No results found.  EKG: Orders placed or performed during the hospital encounter of 12/15/18  . EKG 12-Lead  . EKG 12-Lead    IMPRESSION AND PLAN:  *Altered mental status due to hyponatremia Metabolic encephalopathy  Treat the underlying cause and correct the sodium level. No clear source of infection yet. Check TSH.  Get chest x-ray.  *Hyponatremia This is  secondary to likely hydrochlorothiazide use Check TSH also.  Get chest x-ray. IV fluids with normal saline. Check urine sodium level. Nephrology consult called in.  *Hypertension Hold hydrochlorothiazide Continue other meds.  *History of stroke Continue aspirin and Plavix.  *Active  smoking Smoking cessation counseling was done for 4 minutes by me.  All the records are reviewed and case discussed with ED provider. Management plans discussed with the patient, family and they are in agreement.  CODE STATUS: Full.   TOTAL TIME TAKING CARE OF THIS PATIENT: 45 minutes.  Patient husband was present in the room during my visit.  Altamese DillingVaibhavkumar Teal Bontrager M.D on 12/15/2018   Between 7am to 6pm - Pager - [phone]  After 6pm go to [email or web address] - password [email or web address]  [facility]  Office  [phone]  CC: Primary care physician; Marina GoodellFeldpausch, Dale E, MD   Note: This dictation was prepared with Dragon dictation along with smaller phrase technology. Any transcriptional errors that result from this process are unintentional.

## 2018-12-15 NOTE — ED Notes (Signed)
ED Provider at bedside. 

## 2018-12-15 NOTE — Progress Notes (Signed)
Family Meeting Note  Advance Directive:yes  Today a meeting took place with the Patient and spouse.   The following clinical team members were present during this meeting:MD  The following were discussed:Patient's diagnosis: Hyponatremia, hypertension, generalized weakness, smoking, Patient's progosis: Unable to determine and Goals for treatment: Full Code  Additional follow-up to be provided: Nephrology  Time spent during discussion:20 minutes  Vaughan Basta, MD

## 2018-12-15 NOTE — ED Provider Notes (Signed)
[facility] Emergency Department Provider Note   ____________________________________________   First MD Initiated Contact with Patient 12/15/18 1811     (approximate)  I have reviewed the triage vital signs and the nursing notes.   HISTORY  Chief Complaint Headache, Abnormal Lab, and Altered Mental Status    HPI Ruth Ramirez is a 78 y.o. female with past no history of stroke, hypertension, hyperlipidemia presents to the ED for abnormal labs.  Per husband, patient has had worsening headache over about the past week.  Patient is also complained of "feeling foggy" and having difficulty thinking.  She has not had any numbness or weakness and denies any vision or speech changes.  She has not had any fevers, chills, cough, chest pain, or shortness of breath.  She was seen by her PCP earlier today and notified to come to the ED for further evaluation when her sodium was found to be 118.  She has not had any recent changes to her medications per her husband.        Past Medical History:  Diagnosis Date   Carotid artery stenosis    Depression    GERD (gastroesophageal reflux disease)    History of CVA (cerebrovascular accident)    Hyperlipidemia    Hypertension    Insomnia     There are no active problems to display for this patient.   Past Surgical History:  Procedure Laterality Date   ABDOMINAL HYSTERECTOMY  1977   BACK SURGERY     CAROTID ARTERY ANGIOPLASTY Left 2015   SHOULDER SURGERY Right 1997    Prior to Admission medications   Medication Sig Start Date End Date Taking? Authorizing Provider  amLODipine (NORVASC) 2.5 MG tablet Take 2.5 mg by mouth daily.   Yes [provider]  aspirin 81 MG tablet Take 81 mg by mouth daily.   Yes [provider]  calcium carbonate (OS-CAL) 600 MG TABS tablet Take 600 mg by mouth 2 (two) times daily with a meal.   Yes [provider]  citalopram (CELEXA) 20 MG tablet  Take 20 mg by mouth daily.    [provider]  clopidogrel (PLAVIX) 75 MG tablet Take 75 mg by mouth daily.    [provider]  cyanocobalamin ([employer]S VITAMIN B12) 2000 MCG tablet Take by mouth.    [provider]  cycloSPORINE (RESTASIS) 0.05 % ophthalmic emulsion 1 drop 2 (two) times daily.    [provider]  gabapentin (NEURONTIN) 300 MG capsule Take 300 mg by mouth 3 (three) times daily.    [provider]  hydrochlorothiazide (HYDRODIURIL) 12.5 MG tablet TAKE 1 TABLET BY MOUTH DAILY AS NEEDED FOR HIGH BLOOD PRESSURE OR SWELLING 10/03/17   [provider]  lansoprazole (PREVACID) 30 MG capsule Take 30 mg by mouth daily at 12 noon.    [provider]  latanoprost (XALATAN) 0.005 % ophthalmic solution 1 drop at bedtime.    [provider]  lisinopril (PRINIVIL,ZESTRIL) 40 MG tablet Take 40 mg by mouth daily.    [provider]  oxyCODONE-acetaminophen (PERCOCET/ROXICET) 5-325 MG tablet 1-2 tablets q 8 hours prn severe pain 12/19/17   Payton Mccallumonty, [place], MD  rosuvastatin (CRESTOR) 10 MG tablet Take 10 mg by mouth daily.    [provider]  triazolam (HALCION) 0.125 MG tablet Take 0.125 mg by mouth at bedtime as needed for sleep.    [provider]    Allergies Morphine and related  Family History  Problem Relation Age of Onset   Uterine cancer Mother    Lung cancer Father     Social History Social History   Tobacco Use   Smoking status: Current Every Day Smoker    Packs/day: 0.50    Years: 40.00    Pack years: 20.00    Types: Cigarettes   Smokeless tobacco: Never Used  Substance Use Topics   Alcohol use: No    Alcohol/week: 0.0 standard drinks   Drug use: No    Review of Systems  Constitutional: No fever/chills Eyes: No visual changes. ENT: No sore throat. Cardiovascular: Denies chest pain. Respiratory: Denies shortness of breath. Gastrointestinal: No abdominal pain.  No  nausea, no vomiting.  No diarrhea.  No constipation. Genitourinary: Negative for dysuria. Musculoskeletal: Negative for back pain. Skin: Negative for rash. Neurological: Positive for headaches, negative for focal weakness or numbness.  Positive for confusion.  ____________________________________________   PHYSICAL EXAM:  VITAL SIGNS: ED Triage Vitals  Enc Vitals Group     BP 12/15/18 1758 (!) 165/100     Pulse Rate 12/15/18 1758 94     Resp 12/15/18 1758 16     Temp 12/15/18 1758 98 F (36.7 C)     Temp Source 12/15/18 1758 Oral     SpO2 12/15/18 1758 100 %     Weight 12/15/18 1800 135 lb (61.2 kg)     Height 12/15/18 1800 5\' 2"  (1.575 m)     Head Circumference --      Peak Flow --      Pain Score 12/15/18 1759 8     Pain Loc --      Pain Edu? --      Excl. in [place]? --     Constitutional: Alert and oriented to person place and time. Eyes: Conjunctivae are normal. Head: Atraumatic. Nose: No congestion/rhinnorhea. Mouth/Throat: Mucous membranes are moist. Neck: Normal ROM Cardiovascular: Normal rate, regular rhythm. Grossly normal heart sounds. Respiratory: Normal respiratory effort.  No retractions. Lungs CTAB. Gastrointestinal: Soft and nontender. No distention. Genitourinary: deferred Musculoskeletal: No lower extremity tenderness nor edema. Neurologic:  Normal speech and language. No gross focal neurologic deficits are appreciated. Skin:  Skin is warm, dry and intact. No rash noted. Psychiatric: Mood and affect are normal. Speech and behavior are normal.  ____________________________________________   LABS (all labs ordered are listed, but only abnormal results are displayed)  Labs Reviewed  COMPREHENSIVE METABOLIC PANEL - Abnormal; Notable for the following components:      Result Value   Sodium 116 (*)    Chloride 79 (*)    Glucose, Bld 139 (*)    All other components within normal limits  CBC - Abnormal; Notable for the following components:   MCHC 36.5  (*)    All other components within normal limits  MAGNESIUM - Abnormal; Notable for the following components:   Magnesium 1.6 (*)    All other components within normal limits  OSMOLALITY - Abnormal; Notable for the following components:   Osmolality 249 (*)    All other components within normal limits  SARS CORONAVIRUS 2  PHOSPHORUS  URINALYSIS, COMPLETE (UACMP) WITH MICROSCOPIC   ____________________________________________  EKG  ED ECG REPORT I, Blake Divine, the attending physician, personally viewed and interpreted this ECG.   Date: 12/15/2018  EKG Time: 18:01  Rate: 93  Rhythm: normal sinus rhythm  Axis: Normal  Intervals:none  ST&T Change: Poor R wave progression  ____________________________________________   PROCEDURES  Procedure(s) performed (including Critical Care):  Procedures   ____________________________________________   INITIAL IMPRESSION / ASSESSMENT AND PLAN / ED COURSE       78 year old female sent to the ED after being found to be hyponatremic in the setting of 1 week of headache and some confusion.  Repeat labs here confirm hyponatremia and hypo-osmolality.  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  Case discussed with hospitalist, who accepts patient for admission.      ____________________________________________   FINAL CLINICAL IMPRESSION(S) / ED DIAGNOSES  Final diagnoses:  Hyponatremia  Acute nonintractable headache, unspecified headache type  Confusion     ED Discharge Orders    None       Note:  This document was prepared using Dragon voice recognition software and may include unintentional dictation errors.   Chesley NoonJessup, Bosco Paparella, MD 12/15/18 [phone]

## 2018-12-15 NOTE — ED Notes (Signed)
ED TO INPATIENT HANDOFF REPORT  ED Nurse Name and Phone #: Annie Main [phone]  S Name/Age/Gender Ruth Ramirez 78 y.o. female Room/Bed: ED16A/ED16A  Code Status   Code Status: Not on file  Home/SNF/Other Home Patient oriented to: self, place, time and situation Is this baseline? Yes   Triage Complete: Triage complete  Chief Complaint Alt Mental Status; Headache  Triage Note Patient sent over by [facility] due to hyponatremia. Patient has had worsening headaches and confusion for the last week per husband. Patient's sodium this morning 118. Patient alert to person and place in triage.    Allergies Allergies  Allergen Reactions  . Morphine And Related Anxiety    Level of Care/Admitting Diagnosis ED Disposition    ED Disposition Condition [facility]: [place] [100120]  Level of Care: Med-Surg [16]  Covid Evaluation: Asymptomatic Screening Protocol (No Symptoms)  Diagnosis: Hyponatremia [401027]  Admitting Physician: Vaughan Basta [[id number]]  Attending Physician: Vaughan Basta [phone]  Estimated length of stay: past midnight tomorrow  Certification:: I certify this patient will need inpatient services for at least 2 midnights  PT Class (Do Not Modify): Inpatient [101]  PT Acc Code (Do Not Modify): Private [1]       B Medical/Surgery History Past Medical History:  Diagnosis Date  . Carotid artery stenosis   . Depression   . GERD (gastroesophageal reflux disease)   . History of CVA (cerebrovascular accident)   . Hyperlipidemia   . Hypertension   . Insomnia    Past Surgical History:  Procedure Laterality Date  . ABDOMINAL HYSTERECTOMY  1977  . BACK SURGERY    . CAROTID ARTERY ANGIOPLASTY Left 2015  . SHOULDER SURGERY Right 1997     A IV Location/Drains/Wounds Patient Lines/Drains/Airways Status   Active Line/Drains/Airways    Name:   Placement date:   Placement time:   Site:   Days:   Peripheral IV  12/15/18 Right Antecubital   12/15/18    1810    Antecubital   less than 1          Intake/Output Last 24 hours  Intake/Output Summary (Last 24 hours) at 12/15/2018 2131 Last data filed at 12/15/2018 2056 Gross per 24 hour  Intake 50 ml  Output -  Net 50 ml    Labs/Imaging Results for orders placed or performed during the hospital encounter of 12/15/18 (from the past 48 hour(s))  Comprehensive metabolic panel     Status: Abnormal   Collection Time: 12/15/18  6:08 PM  Result Value Ref Range   Sodium 116 (LL) 135 - 145 mmol/L    Comment: CRITICAL RESULT CALLED TO, READ BACK BY AND VERIFIED WITH Sharalee Witman AT 1845 ON 12/15/2018 SNG    Potassium 4.3 3.5 - 5.1 mmol/L   Chloride 79 (L) 98 - 111 mmol/L   CO2 23 22 - 32 mmol/L   Glucose, Bld 139 (H) 70 - 99 mg/dL   BUN 9 8 - 23 mg/dL   Creatinine, Ser 0.79 0.44 - 1.00 mg/dL   Calcium 9.3 8.9 - 10.3 mg/dL   Total Protein 7.9 6.5 - 8.1 g/dL   Albumin 4.5 3.5 - 5.0 g/dL   AST 28 15 - 41 U/L   ALT 14 0 - 44 U/L   Alkaline Phosphatase 106 38 - 126 U/L   Total Bilirubin 0.4 0.3 - 1.2 mg/dL   GFR calc non Af Amer >60 >60 mL/min   GFR calc Af Amer >60 >60  mL/min   Anion gap 14 5 - 15    Comment: Performed at [facility], [address] [place], [place] [address]  CBC     Status: Abnormal   Collection Time: 12/15/18  6:08 PM  Result Value Ref Range   WBC 6.7 4.0 - 10.5 K/uL   RBC 4.98 3.87 - 5.11 MIL/uL   Hemoglobin 14.8 12.0 - 15.0 g/dL   HCT 60.440.6 54.036.0 - 98.146.0 %   MCV 81.5 80.0 - 100.0 fL   MCH 29.7 26.0 - 34.0 pg   MCHC 36.5 (H) 30.0 - 36.0 g/dL   RDW 19.113.3 47.811.5 - 29.515.5 %   Platelets 334 150 - 400 K/uL   nRBC 0.0 0.0 - 0.2 %    Comment: Performed at [facility], [address] [place], [place] [address]  Magnesium     Status: Abnormal   Collection Time: 12/15/18  6:08 PM  Result Value Ref Range   Magnesium 1.6 (L) 1.7 - 2.4 mg/dL    Comment: Performed at [facility], [address]  [place], [place] [address]  Phosphorus     Status: None   Collection Time: 12/15/18  6:08 PM  Result Value Ref Range   Phosphorus 2.5 2.5 - 4.6 mg/dL    Comment: Performed at [facility], [address] [place], [place] [address]  Urinalysis, Complete w Microscopic     Status: Abnormal   Collection Time: 12/15/18  6:34 PM  Result Value Ref Range   Color, Urine YELLOW (A) YELLOW   APPearance CLEAR (A) CLEAR   Specific Gravity, Urine 1.012 1.005 - 1.030   pH 7.0 5.0 - 8.0   Glucose, UA NEGATIVE NEGATIVE mg/dL   Hgb urine dipstick NEGATIVE NEGATIVE   Bilirubin Urine NEGATIVE NEGATIVE   Ketones, ur NEGATIVE NEGATIVE mg/dL   Protein, ur 952100 (A) NEGATIVE mg/dL   Nitrite NEGATIVE NEGATIVE   Leukocytes,Ua TRACE (A) NEGATIVE   RBC / HPF 6-10 0 - 5 RBC/hpf   WBC, UA 0-5 0 - 5 WBC/hpf   Bacteria, UA RARE (A) NONE SEEN   Squamous Epithelial / LPF 0-5 0 - 5   Mucus PRESENT     Comment: Performed at [facility], [address] [place], [place] [address]  Osmolality     Status: Abnormal   Collection Time: 12/15/18  6:34 PM  Result Value Ref Range   Osmolality 249 (LL) 275 - 295 mOsm/kg    Comment: CRITICAL RESULT CALLED TO, READ BACK BY AND VERIFIED WITH: Benson NorwaySTEPHEN Nashla Althoff RN AT 1912 ON 12/15/2018 SNG  Performed at [facility], [address] [place], [place] [address]   Sodium, urine, random     Status: None   Collection Time: 12/15/18  6:34 PM  Result Value Ref Range   Sodium, Ur 117 mmol/L    Comment: Performed at [facility], [address] [place], [place] [address]   Dg Chest 1 View  Result Date: 12/15/2018 CLINICAL DATA:  Weakness EXAM: CHEST  1 VIEW COMPARISON:  Radiographs December 19, 2017 FINDINGS: Coarse interstitial changes are similar to prior though accentuated by the portable technique. No consolidation, features of edema, pneumothorax, or effusion. Pulmonary vascularity is normally distributed. The aorta is calcified and tortuous.  Cardiomediastinal contours are otherwise stable. No acute osseous or soft tissue abnormality. Multiple remote left rib fractures are again seen. A plate and screw fixation is seen along the proximal right humerus with additional surgical pin. IMPRESSION: No  acute cardiopulmonary abnormality Electronically Signed   By: Kreg ShropshirePrice  DeHay M.D.   On: 12/15/2018 21:16    Pending Labs Unresulted Labs (From admission, onward)    Start     Ordered   12/15/18 2055  TSH  Add-on,   AD     12/15/18 2054   12/15/18 1845  SARS CORONAVIRUS 2 Nasal Swab Aptima Multi Swab  (Asymptomatic/Tier 2 Patients Labs)  Once,   STAT    Question Answer Comment  Is this test for diagnosis or screening Screening   Symptomatic for COVID-19 as defined by CDC No   Hospitalized for COVID-19 No   Admitted to ICU for COVID-19 No   Previously tested for COVID-19 No   Resident in a congregate (group) care setting No   Employed in healthcare setting No   Pregnant No      12/15/18 1844   Signed and Held  Basic metabolic panel  Tomorrow morning,   R     Signed and Held   Signed and Held  CBC  Tomorrow morning,   R     Signed and Held   Signed and Held  CBC  (heparin)  Once,   R    Comments: Baseline for heparin therapy IF NOT ALREADY DRAWN.  Notify MD if PLT < 100 K.    Signed and Held   Signed and Held  Creatinine, serum  (heparin)  Once,   R    Comments: Baseline for heparin therapy IF NOT ALREADY DRAWN.    Signed and Held          Vitals/Pain Today's Vitals   12/15/18 2000 12/15/18 2030 12/15/18 2100 12/15/18 2110  BP: (!) 158/62 (!) 167/67 (!) 153/92   Pulse: 79 82 81   Resp:      Temp:      TempSrc:      SpO2: 98% 94% 97%   Weight:      Height:      PainSc:    8     Isolation Precautions No active isolations  Medications Medications  0.9 %  sodium chloride infusion (has no administration in time range)  magnesium sulfate IVPB 2 g 50 mL (has no administration in time range)  acetaminophen (TYLENOL)  tablet 1,000 mg (1,000 mg Oral Given 12/15/18 1920)  magnesium sulfate IVPB 2 g 50 mL (0 g Intravenous Stopped 12/15/18 2056)    Mobility walks with person assist Low fall risk     R Recommendations: See Admitting Provider Note  Report given to:   Additional Notes:

## 2018-12-16 ENCOUNTER — Inpatient Hospital Stay: Payer: Self-pay

## 2018-12-16 ENCOUNTER — Inpatient Hospital Stay: Payer: Medicare Other

## 2018-12-16 DIAGNOSIS — E871 Hypo-osmolality and hyponatremia: Principal | ICD-10-CM

## 2018-12-16 DIAGNOSIS — R911 Solitary pulmonary nodule: Secondary | ICD-10-CM

## 2018-12-16 LAB — BASIC METABOLIC PANEL
Anion gap: 9 (ref 5–15)
BUN: 8 mg/dL (ref 8–23)
CO2: 21 mmol/L — ABNORMAL LOW (ref 22–32)
Calcium: 8.3 mg/dL — ABNORMAL LOW (ref 8.9–10.3)
Chloride: 87 mmol/L — ABNORMAL LOW (ref 98–111)
Creatinine, Ser: 0.79 mg/dL (ref 0.44–1.00)
GFR calc Af Amer: >60 mL/min (ref >60)
GFR calc non Af Amer: >60 mL/min (ref >60)
Glucose, Bld: 112 mg/dL — ABNORMAL HIGH (ref 70–99)
Potassium: 3.6 mmol/L (ref 3.5–5.1)
Sodium: 117 mmol/L — CL (ref 135–145)

## 2018-12-16 LAB — SODIUM
Sodium: 120 mmol/L — ABNORMAL LOW (ref 135–145)
Sodium: 119 mmol/L — CL (ref 135–145)
Sodium: 136 mmol/L (ref 135–145)
Sodium: 123 mmol/L — ABNORMAL LOW (ref 135–145)
Sodium: 123 mmol/L — ABNORMAL LOW (ref 135–145)

## 2018-12-16 LAB — CBC
HCT: 36.2 % (ref 36.0–46.0)
Hemoglobin: 13.2 g/dL (ref 12.0–15.0)
MCH: 29.7 pg (ref 26.0–34.0)
MCHC: 36.5 g/dL — ABNORMAL HIGH (ref 30.0–36.0)
MCV: 81.3 fL (ref 80.0–100.0)
Platelets: 305 10*3/uL (ref 150–400)
RBC: 4.45 MIL/uL (ref 3.87–5.11)
RDW: 13.2 % (ref 11.5–15.5)
WBC: 6.6 10*3/uL (ref 4.0–10.5)
nRBC: 0 % (ref 0.0–0.2)

## 2018-12-16 LAB — SODIUM, URINE, RANDOM: Sodium, Ur: 33 mmol/L

## 2018-12-16 LAB — GLUCOSE, CAPILLARY: Glucose-Capillary: 113 mg/dL — ABNORMAL HIGH (ref 70–99)

## 2018-12-16 LAB — SARS CORONAVIRUS 2 (TAT 6-24 HRS): SARS Coronavirus 2: NEGATIVE

## 2018-12-16 LAB — OSMOLALITY, URINE: Osmolality, Ur: 148 mOsm/kg — ABNORMAL LOW (ref 300–900)

## 2018-12-16 LAB — OSMOLALITY: Osmolality: 251 mOsm/kg — ABNORMAL LOW (ref 275–295)

## 2018-12-16 LAB — MRSA PCR SCREENING: MRSA by PCR: NEGATIVE

## 2018-12-16 MED ORDER — SODIUM CHLORIDE 0.9% FLUSH
10.0000 mL | Freq: Two times a day (BID) | INTRAVENOUS | Status: DC
  Administered 2018-12-16 – 2018-12-19 (×7): 10 mL

## 2018-12-16 MED ORDER — ACETAMINOPHEN 325 MG PO TABS
650.0000 mg | ORAL_TABLET | Freq: Four times a day (QID) | ORAL | Status: DC | PRN
  Administered 2018-12-16 – 2018-12-19 (×10): 650 mg via ORAL
  Filled 2018-12-16 (×10): qty 2

## 2018-12-16 MED ORDER — IOHEXOL 300 MG/ML  SOLN
75.0000 mL | Freq: Once | INTRAMUSCULAR | Status: AC | PRN
  Administered 2018-12-16: 75 mL via INTRAVENOUS

## 2018-12-16 MED ORDER — SODIUM CHLORIDE 3 % IV SOLN
INTRAVENOUS | Status: DC
  Administered 2018-12-16: 15 mL/h via INTRAVENOUS
  Filled 2018-12-16: qty 500

## 2018-12-16 MED ORDER — PANTOPRAZOLE SODIUM 40 MG PO TBEC
40.0000 mg | DELAYED_RELEASE_TABLET | Freq: Every day | ORAL | Status: DC
  Administered 2018-12-17 – 2018-12-19 (×3): 40 mg via ORAL
  Filled 2018-12-16 (×3): qty 1

## 2018-12-16 MED ORDER — SODIUM CHLORIDE 0.9% FLUSH: 10.0000 mL | INTRAVENOUS | Status: DC | PRN

## 2018-12-16 MED ORDER — SODIUM CHLORIDE 3 % IV SOLN
INTRAVENOUS | Status: DC
  Administered 2018-12-16: 15:00:00 30 mL/h via INTRAVENOUS
  Filled 2018-12-16 (×2): qty 500

## 2018-12-16 NOTE — Consult Note (Signed)
[facility] CONSULT NOTE    Date: 12/16/2018                  Patient Name:  Ruth Ramirez  MRN: [id number]  DOB: [birth year]  Age / Sex: 78 y.o., female         PCP: Sofie Hartigan, MD                 Service Requesting Consult: Hospitalist                 Reason for Consult: Hyponatremia            History of Present Illness: Patient is a 78 y.o. female with a PMHx of carotid artery stenosis, depression, GERD, history of CVA, hyperlipidemia, hypertension, insomnia, tobacco abuse, who was admitted to [facility] on 12/15/2018 for evaluation of altered mental status and abnormal labs.  She saw her primary care physician prior to admission.  Her serum sodium was found to be 118 as an outpatient.  She was advised to come to the hospital for further evaluation.  She was noted to be on HCTZ as an outpatient.  She endorses nausea but denies vomiting or diarrhea.  She was started on 0.9 normal saline with minimal correction of her serum sodium.  We advised hospitalist to transfer to the intensive care unit and to start on 3% saline.   Medications: Outpatient medications: Medications Prior to Admission  Medication Sig Dispense Refill Last Dose  . amLODipine (NORVASC) 2.5 MG tablet Take 2.5 mg by mouth daily.   12/15/2018 at 0900  . aspirin 81 MG tablet Take 81 mg by mouth daily.   unknown at Unknown time  . calcium carbonate (OS-CAL) 600 MG TABS tablet Take 600 mg by mouth 2 (two) times daily with a meal.   unknown at Unknown time  . clopidogrel (PLAVIX) 75 MG tablet Take 75 mg by mouth daily.   unknown at unknown  . cyanocobalamin ([employer] VITAMIN B12) 2000 MCG tablet Take by mouth.   unknown at unknown  . gabapentin (NEURONTIN) 300 MG capsule Take 300 mg by mouth 3 (three) times daily.   unknown at unknown  . hydrochlorothiazide (HYDRODIURIL) 12.5 MG tablet TAKE 1 TABLET BY MOUTH DAILY AS NEEDED FOR HIGH BLOOD PRESSURE OR SWELLING   prn at prn  . lansoprazole (PREVACID) 30  MG capsule Take 30 mg by mouth daily at 12 noon.   unknown at Unknown time  . lisinopril (PRINIVIL,ZESTRIL) 40 MG tablet Take 40 mg by mouth daily.   unknown at Unknown time  . triazolam (HALCION) 0.125 MG tablet Take 0.125 mg by mouth at bedtime as needed for sleep. May take 1/2 extra as needed   prn at prn    Current medications: Current Facility-Administered Medications  Medication Dose Route Frequency Provider Last Rate Last Dose  . acetaminophen (TYLENOL) tablet 650 mg  650 mg Oral Q6H PRN Lance Coon, MD   650 mg at 12/16/18 0957  . amLODipine (NORVASC) tablet 2.5 mg  2.5 mg Oral Daily Vaughan Basta, MD   2.5 mg at 12/16/18 0959  . aspirin EC tablet 81 mg  81 mg Oral Daily Vaughan Basta, MD   81 mg at 12/16/18 0959  . calcium-vitamin D (OSCAL WITH D) 500-200 MG-UNIT per tablet 1 tablet  1 tablet Oral BID WC Vaughan Basta, MD   1 tablet at 12/16/18 0958  . clopidogrel (PLAVIX) tablet 75 mg  75 mg Oral Daily Vaughan Basta,  MD   75 mg at 12/16/18 0958  . docusate sodium (COLACE) capsule 100 mg  100 mg Oral BID PRN Altamese DillingVachhani, Vaibhavkumar, MD      . gabapentin (NEURONTIN) capsule 300 mg  300 mg Oral TID Altamese DillingVachhani, Vaibhavkumar, MD   300 mg at 12/16/18 0959  . heparin injection 5,000 Units  5,000 Units Subcutaneous Q8H Altamese DillingVachhani, Vaibhavkumar, MD   5,000 Units at 12/16/18 0524  . lisinopril (ZESTRIL) tablet 40 mg  40 mg Oral Daily Altamese DillingVachhani, Vaibhavkumar, MD   40 mg at 12/16/18 0959  . pantoprazole (PROTONIX) EC tablet 20 mg  20 mg Oral Daily Altamese DillingVachhani, Vaibhavkumar, MD   20 mg at 12/16/18 0958  . sodium chloride (hypertonic) 3 % solution   Intravenous Continuous Johnie Stadel, MD 30 mL/hr at 12/16/18 1502 30 mL/hr at 12/16/18 1502  . sodium chloride flush (NS) 0.9 % injection 10-40 mL  10-40 mL Intracatheter Q12H Sainani, Vivek J, MD      . sodium chloride flush (NS) 0.9 % injection 10-40 mL  10-40 mL Intracatheter PRN Houston SirenSainani, Vivek J, MD      . triazolam  (HALCION) tablet 0.125 mg  0.125 mg Oral QHS PRN Altamese DillingVachhani, Vaibhavkumar, MD   0.125 mg at 12/16/18 0124  . vitamin B-12 (CYANOCOBALAMIN) tablet 2,000 mcg  2,000 mcg Oral Daily Altamese DillingVachhani, Vaibhavkumar, MD   2,000 mcg at 12/16/18 [id number]      Allergies: Allergies  Allergen Reactions  . Morphine And Related Anxiety      Past Medical History: Past Medical History:  Diagnosis Date  . Carotid artery stenosis   . Depression   . GERD (gastroesophageal reflux disease)   . History of CVA (cerebrovascular accident)   . Hyperlipidemia   . Hypertension   . Insomnia      Past Surgical History: Past Surgical History:  Procedure Laterality Date  . ABDOMINAL HYSTERECTOMY  1977  . BACK SURGERY    . CAROTID ARTERY ANGIOPLASTY Left 2015  . SHOULDER SURGERY Right 1997     Family History: Family History  Problem Relation Age of Onset  . Uterine cancer Mother   . Lung cancer Father      Social History: Social History   Socioeconomic History  . Marital status: Married    Spouse name: Not on file  . Number of children: Not on file  . Years of education: Not on file  . Highest education level: Not on file  Occupational History  . Not on file  Social Needs  . Financial resource strain: Not on file  . Food insecurity    Worry: Not on file    Inability: Not on file  . Transportation needs    Medical: Not on file    Non-medical: Not on file  Tobacco Use  . Smoking status: Current Every Day Smoker    Packs/day: 0.50    Years: 40.00    Pack years: 20.00    Types: Cigarettes  . Smokeless tobacco: Never Used  Substance and Sexual Activity  . Alcohol use: No    Alcohol/week: 0.0 standard drinks  . Drug use: No  . Sexual activity: Not on file  Lifestyle  . Physical activity    Days per week: Not on file    Minutes per session: Not on file  . Stress: Not on file  Relationships  . Social Musicianconnections    Talks on phone: Not on file    Gets together: Not on file    Attends  religious service:  Not on file    Active member of club or organization: Not on file    Attends meetings of clubs or organizations: Not on file    Relationship status: Not on file  . Intimate partner violence    Fear of current or ex partner: Not on file    Emotionally abused: Not on file    Physically abused: Not on file    Forced sexual activity: Not on file  Other Topics Concern  . Not on file  Social History Narrative  . Not on file     Review of Systems: Review of Systems  Constitutional: Negative for chills and fever.  HENT: Negative for congestion and tinnitus.   Eyes: Negative for blurred vision.  Respiratory: Positive for cough and shortness of breath.   Cardiovascular: Negative for chest pain and palpitations.  Gastrointestinal: Positive for nausea. Negative for abdominal pain, heartburn and vomiting.  Genitourinary: Negative for dysuria and urgency.  Musculoskeletal: Negative for joint pain and myalgias.  Skin: Negative for itching and rash.  Neurological: Negative for dizziness and focal weakness.  Endo/Heme/Allergies: Negative for polydipsia. Does not bruise/bleed easily.  Psychiatric/Behavioral: Negative for depression. The patient is not nervous/anxious.      Vital Signs: Blood pressure (!) 145/94, pulse 86, temperature 98.3 F (36.8 C), temperature source Oral, resp. rate 18, height 5\' 2"  (1.575 m), weight 57.4 kg, SpO2 96 %.  Weight trends: Filed Weights   12/15/18 1800 12/16/18 1450  Weight: 61.2 kg 57.4 kg    Physical Exam: General: NAD, resting in bed  Head: Normocephalic, atraumatic.  Eyes: Anicteric, EOMI  Nose: Mucous membranes moist, not inflammed, nonerythematous.  Throat: Oropharynx nonerythematous, no exudate appreciated.   Neck: Supple, trachea midline.  Lungs:  Normal respiratory effort. Clear to auscultation BL without crackles or wheezes.  Heart: RRR. S1 and S2 normal without gallop, murmur, or rubs.  Abdomen:  BS normoactive. Soft,  Nondistended, non-tender.  No masses or organomegaly.  Extremities: No pretibial edema.  Neurologic: A&O X3, Motor strength is 5/5 in the all 4 extremities  Skin: No visible rashes, scars.    Lab results: Basic Metabolic Panel: Recent Labs  Lab 12/15/18 1808 12/16/18 0437 12/16/18 1136 12/16/18 1300  NA 116* 117* 120* 119*  K 4.3 3.6  --   --   CL 79* 87*  --   --   CO2 23 21*  --   --   GLUCOSE 139* 112*  --   --   BUN 9 8  --   --   CREATININE 0.79 0.79  --   --   CALCIUM 9.3 8.3*  --   --   MG 1.6*  --   --   --   PHOS 2.5  --   --   --     Liver Function Tests: Recent Labs  Lab 12/15/18 1808  AST 28  ALT 14  ALKPHOS 106  BILITOT 0.4  PROT 7.9  ALBUMIN 4.5   No results for input(s): LIPASE, AMYLASE in the last 168 hours. No results for input(s): AMMONIA in the last 168 hours.  CBC: Recent Labs  Lab 12/15/18 1808 12/16/18 0437  WBC 6.7 6.6  HGB 14.8 13.2  HCT 40.6 36.2  MCV 81.5 81.3  PLT 334 305    Cardiac Enzymes: No results for input(s): CKTOTAL, CKMB, CKMBINDEX, TROPONINI in the last 168 hours.  BNP: Invalid input(s): POCBNP  CBG: Recent Labs  Lab 12/16/18 1453  GLUCAP 113*  Microbiology: Results for orders placed or performed during the hospital encounter of 12/15/18  SARS CORONAVIRUS 2 Nasal Swab Aptima Multi Swab     Status: None   Collection Time: 12/15/18  7:03 PM   Specimen: Aptima Multi Swab; Nasal Swab  Result Value Ref Range Status   SARS Coronavirus 2 NEGATIVE NEGATIVE Final    Comment: (NOTE) SARS-CoV-2 target nucleic acids are NOT DETECTED. The SARS-CoV-2 RNA is generally detectable in upper and lower respiratory specimens during the acute phase of infection. Negative results do not preclude SARS-CoV-2 infection, do not rule out co-infections with other pathogens, and should not be used as the sole basis for treatment or other patient management decisions. Negative results must be combined with clinical  observations, patient history, and epidemiological information. The expected result is Negative. Fact Sheet for Patients: HairSlick.nohttps://www.fda.gov/media/138098/download Fact Sheet for Healthcare Providers: quierodirigir.comhttps://www.fda.gov/media/138095/download This test is not yet approved or cleared by the [place] FDA and  has been authorized for detection and/or diagnosis of SARS-CoV-2 by FDA under an Emergency Use Authorization (EUA). This EUA will remain  in effect (meaning this test can be used) for the duration of the COVID-19 declaration under Section 56 4(b)(1) of the Act, 21 U.S.C. section 360bbb-3(b)(1), unless the authorization is terminated or revoked sooner. Performed at [facility], [address] [place], [place] [address]     Coagulation Studies: No results for input(s): LABPROT, INR in the last 72 hours.  Urinalysis: Recent Labs    12/15/18 1834  COLORURINE YELLOW*  LABSPEC 1.012  PHURINE 7.0  GLUCOSEU NEGATIVE  HGBUR NEGATIVE  BILIRUBINUR NEGATIVE  KETONESUR NEGATIVE  PROTEINUR 100*  NITRITE NEGATIVE  LEUKOCYTESUR TRACE*      Imaging: Dg Chest 1 View  Result Date: 12/15/2018 CLINICAL DATA:  Weakness EXAM: CHEST  1 VIEW COMPARISON:  Radiographs December 19, 2017 FINDINGS: Coarse interstitial changes are similar to prior though accentuated by the portable technique. No consolidation, features of edema, pneumothorax, or effusion. Pulmonary vascularity is normally distributed. The aorta is calcified and tortuous. Cardiomediastinal contours are otherwise stable. No acute osseous or soft tissue abnormality. Multiple remote left rib fractures are again seen. A plate and screw fixation is seen along the proximal right humerus with additional surgical pin. IMPRESSION: No acute cardiopulmonary abnormality Electronically Signed   By: Kreg ShropshirePrice  DeHay M.D.   On: 12/15/2018 21:16   Ct Chest W Contrast  Result Date: 12/16/2018 CLINICAL DATA:  Hyponatremia.  Concern for lung  mass. EXAM: CT CHEST WITH CONTRAST TECHNIQUE: Multidetector CT imaging of the chest was performed during intravenous contrast administration. CONTRAST:  75mL OMNIPAQUE IOHEXOL 300 MG/ML  SOLN COMPARISON:  None. FINDINGS: Cardiovascular: There is no large centrally located pulmonary embolus. Detection of smaller segmental and subsegmental pulmonary emboli is limited by contrast bolus timing. There is no thoracic aortic dissection. Atherosclerotic changes are noted of the thoracic aorta. The thoracic aorta is normal in size. Coronary artery calcifications are noted. The heart size is normal. Mediastinum/Nodes: --No mediastinal or hilar lymphadenopathy. --No axillary lymphadenopathy. --No supraclavicular lymphadenopathy. --multiple thyroid nodules are noted bilaterally. --The esophagus is unremarkable Lungs/Pleura: There is a 6 mm pulmonary nodule in the right upper lobe (axial series 3, image 51). There is a perifissural plate like opacity involving the right middle lobe measuring approximately 9 mm (axial series 3, image 69). Given its appearance on the coronal view, this is almost certainly a benign process. There is no pneumothorax. No large pleural effusion. The trachea is essentially unremarkable however  there does appear to be some debris within the left mainstem bronchus. Upper Abdomen: There is no acute abnormality. There is some mild thickening of both adrenal glands which is nonspecific. There is a focal outpouching of the proximal abdominal aorta posteriorly at the level of the celiac axis. This measures approximately 1.4 x 1 cm. The aorta at this level measures approximately 2 point 5 cm in diameter. Musculoskeletal: The patient is status post ORIF of the right humerus. There is a subacute appearing partially visualized impacted fracture of the proximal left humerus. There are multiple old left-sided rib fractures. There are superimposed acute fractures involving the ninth and tenth ribs posterolaterally  on the left. Review of the MIP images confirms the above findings. IMPRESSION: 1. There is a 6 mm pulmonary nodule in the right upper lobe as detailed above. Non-contrast chest CT at 6-12 months is recommended. If the nodule is stable at time of repeat CT, then future CT at 18-24 months (from today's scan) is considered optional for low-risk patients, but is recommended for high-risk patients. This recommendation follows the consensus statement: Guidelines for Management of Incidental Pulmonary Nodules Detected on CT Images: From the Fleischner Society 2017; Radiology 2017; 284:228-243. 2. Subacute appearing partially visualized impacted fracture of the proximal left humerus. Dedicated left shoulder radiographs may be useful for further evaluation of this finding. 3. Multiple acute and chronic left-sided rib fractures as detailed above. 4. There are bilateral thyroid nodules. These can be further evaluated with ultrasound as clinically indicated. 5. Focal outpouching of the posterior proximal abdominal aorta as detailed above. This is of doubtful clinical significance, however a follow-up CT a in 6 months should be considered to confirm stability of this finding. Aortic Atherosclerosis (ICD10-I70.0) and Emphysema (ICD10-J43.9). Electronically Signed   By: Katherine Mantle M.D.   On: 12/16/2018 13:53   [place] Ekg Site Rite  Result Date: 12/16/2018 If Site Rite image not attached, placement could not be confirmed due to current cardiac rhythm.     Assessment & Plan: Pt is a 78 y.o. female with a PMHx of carotid artery stenosis, depression, GERD, history of CVA, hyperlipidemia, hypertension, insomnia, tobacco abuse, who was admitted to [facility] on 12/15/2018 for evaluation of altered mental status and abnormal labs.   1. Hyponatremia. 2.  Small pulmonary nodule. 3.  Hypertension.  Plan:  We are asked to see the patient for evaluation management of hyponatremia.  She appears euvolemic therefore SIADH remains a  consideration.  However of note patient was also on HCTZ.  Agree with its discontinuation.  We have discontinued 0.9 normal saline in favor of 3% saline at 30 cc/h.  Target sodium within the next 24 hours is 126.  Patient has been moved to the critical care unit in order to provide her with the drip.  PICC line has also been placed.  She is found to have a small pulmonary nodule that will require follow-up and we recommend further evaluation by pulmonary medicine as an outpatient.  Further plan as patient progresses.  Thanks for consultation.

## 2018-12-16 NOTE — Progress Notes (Addendum)
Name: Ruth Ramirez MRN: [id number] DOB: 1941/03/02     CONSULTATION DATE: 12/15/2018  Consultation requested by Dr. Verdell Carmine for assistance with management of hyponatremia.  CHIEF COMPLAINT:  Headache and confusion  HISTORY OF PRESENT ILLNESS: Ruth Ramirez is a 78 y.o female with a past medical history of CAD, Hypertension, Hyperlipidemia, Hx of CVA, GERD, insomnia, and depression. Current smoker, described as a "heavy smoker" per husband, >20 pack year history. She also has a history of a recent, 2 months ago, fall with broken ribs and humerus per husband. She presented to the ED for hyponatremia. She was seen by her PCP today for worsening headache and confusion x 1wk when labs showed her sodium to be 118. In ED labs demonstrated further electrolyte imbalance of hypochloremia (78) and mild hypomagnesemia (1.6). TSH levels within normal range. Serum osmolality was 249 mOsm/kg. CXR also obtained in ED. She was admitted (8/11) and started on IV fluids, normal saline and her hydrochlorothiazide was stopped as this is a suspected cause of her hyponatremia. Serum sodium showed little improvement and has been transferred to the ICU and started on Hypertonic 3% solution.   SIGNIFICANT EVENTS: 8/11> CXR- no acute cardiopulmonary abnormality  8/12>CT Chest-  61mm pulm nodule in RUL, multiple acute and chronic left-sided rib fractures, bilateral thyroid nodules, outpouching in the posterior proximal abdominal aorta   CULTURES:  8/11>SARS COVID19: negative   PAST MEDICAL HISTORY :   has a past medical history of Carotid artery stenosis, Depression, GERD (gastroesophageal reflux disease), History of CVA (cerebrovascular accident), Hyperlipidemia, Hypertension, and Insomnia.  has a past surgical history that includes Back surgery; Carotid angioplasty (Left, 2015); Shoulder surgery (Right, 1997); and Abdominal hysterectomy (1977).   Allergies  Allergen Reactions  . Morphine And Related Anxiety     FAMILY HISTORY:  family history includes Lung cancer in her father; Uterine cancer in her mother. SOCIAL HISTORY:  reports that she has been smoking cigarettes. She has a 20.00 pack-year smoking history. She has never used smokeless tobacco. She reports that she does not drink alcohol or use drugs.  REVIEW OF SYSTEMS:   Gen:  Denies  fever, sweats, chills, weakness  HEENT: Denies blurred vision, double vision, ear pain, eye pain, hearing loss, nose bleeds, sore throat Cardiac:  dizziness, chest pain or heaviness, chest tightness,edema Resp:   Cough (chronic, stable), -sputum production, -shortness of breath,-wheezing, -hemoptysis Gi: Denies swallowing difficulty, stomach pain, nausea or vomiting, diarrhea, constipation, bowel incontinence Gu:  Denies bladder incontinence, burning urine Ext:   Denies Joint pain, stiffness or swelling Skin: Denies  skin rash, easy bruising or bleeding or hives Endoc:  Denies polyuria, polydipsia , polyphagia or weight change Psych:   Denies depression, insomnia or hallucinations  Other:  All other systems negative   VITAL SIGNS: Temp:  [97.5 F (36.4 C)-98.2 F (36.8 C)] 98.2 F (36.8 C) (08/12 0820) Pulse Rate:  [74-94] 86 (08/12 0820) Resp:  [13-18] 16 (08/12 0820) BP: (128-178)/(62-100) 128/64 (08/12 0820) SpO2:  [94 %-100 %] 98 % (08/12 0820) Weight:  [61.2 kg] 61.2 kg (08/11 1800)   I/O last 3 completed shifts: In: 720.7 [P.O.:120; I.V.:550.7; IV Piggyback:50] Out: -  Total I/O In: 860.7 [P.O.:360; I.V.:500.7] Out: -    SpO2: 98 % O2 Flow Rate (L/min): 100 L/min   Physical Examination: GENERAL:anxious concerning current health status HEAD: Normocephalic, atraumatic.  EYES: Pupils equal, round, reactive to light.  No scleral icterus.  MOUTH: Moist mucosal membrane. NECK: Supple. No JVD.  PULMONARY: lungs clear to ascultation bilaterally, normal effort, no accessory muscle use CARDIOVASCULAR: S1 and S2. Regular rate and rhythm. No  murmurs, rubs, or gallops.  GASTROINTESTINAL: Soft, nontender, -distended. No masses. Positive bowel sounds. No hepatosplenomegaly.  MUSCULOSKELETAL: No swelling, clubbing, or edema, 5+ strength bilaterally in lower extremities  NEUROLOGIC: Alert and oriented x 3, able to follow commands  SKIN:intact,warm,dry  I personally reviewed lab work that was obtained in last 24 hrs. CBC Latest Ref Rng & Units 12/16/2018 12/15/2018 05/27/2016  WBC 4.0 - 10.5 K/uL 6.6 6.7 9.3  Hemoglobin 12.0 - 15.0 g/dL 16.113.2 09.614.8 04.514.3  Hematocrit 36.0 - 46.0 % 36.2 40.6 42.3  Platelets 150 - 400 K/uL 305 334 225   BMP Latest Ref Rng & Units 12/16/2018 12/16/2018 12/16/2018  Glucose 70 - 99 mg/dL - - 409(W112(H)  BUN 8 - 23 mg/dL - - 8  Creatinine 1.190.44 - 1.00 mg/dL - - 1.470.79  Sodium 829135 - 145 mmol/L 119(LL) 120(L) 117(LL)  Potassium 3.5 - 5.1 mmol/L - - 3.6  Chloride 98 - 111 mmol/L - - 87(L)  CO2 22 - 32 mmol/L - - 21(L)  Calcium 8.9 - 10.3 mg/dL - - 8.3(L)   Osmolality: 251 mOsm/kg  MEDICATIONS: I have reviewed all medications and confirmed regimen as documented   CULTURE RESULTS   Recent Results (from the past 240 hour(s))  SARS CORONAVIRUS 2 Nasal Swab Aptima Multi Swab     Status: None   Collection Time: 12/15/18  7:03 PM   Specimen: Aptima Multi Swab; Nasal Swab  Result Value Ref Range Status   SARS Coronavirus 2 NEGATIVE NEGATIVE Final    Comment: (NOTE) SARS-CoV-2 target nucleic acids are NOT DETECTED. The SARS-CoV-2 RNA is generally detectable in upper and lower respiratory specimens during the acute phase of infection. Negative results do not preclude SARS-CoV-2 infection, do not rule out co-infections with other pathogens, and should not be used as the sole basis for treatment or other patient management decisions. Negative results must be combined with clinical observations, patient history, and epidemiological information. The expected result is Negative. Fact Sheet for Patients:  HairSlick.nohttps://www.fda.gov/media/138098/download Fact Sheet for Healthcare Providers: quierodirigir.comhttps://www.fda.gov/media/138095/download This test is not yet approved or cleared by the [place] FDA and  has been authorized for detection and/or diagnosis of SARS-CoV-2 by FDA under an Emergency Use Authorization (EUA). This EUA will remain  in effect (meaning this test can be used) for the duration of the COVID-19 declaration under Section 56 4(b)(1) of the Act, 21 U.S.C. section 360bbb-3(b)(1), unless the authorization is terminated or revoked sooner. Performed at [facility], [address] [place], [place] [address]           IMAGING    Dg Chest 1 View  Result Date: 12/15/2018 CLINICAL DATA:  Weakness EXAM: CHEST  1 VIEW COMPARISON:  Radiographs December 19, 2017 FINDINGS: Coarse interstitial changes are similar to prior though accentuated by the portable technique. No consolidation, features of edema, pneumothorax, or effusion. Pulmonary vascularity is normally distributed. The aorta is calcified and tortuous. Cardiomediastinal contours are otherwise stable. No acute osseous or soft tissue abnormality. Multiple remote left rib fractures are again seen. A plate and screw fixation is seen along the proximal right humerus with additional surgical pin. IMPRESSION: No acute cardiopulmonary abnormality Electronically Signed   By: Kreg ShropshirePrice  DeHay M.D.   On: 12/15/2018 21:16   Ct Chest W Contrast  Result Date: 12/16/2018 CLINICAL DATA:  Hyponatremia.  Concern for lung mass. EXAM:  CT CHEST WITH CONTRAST TECHNIQUE: Multidetector CT imaging of the chest was performed during intravenous contrast administration. CONTRAST:  75mL OMNIPAQUE IOHEXOL 300 MG/ML  SOLN COMPARISON:  None. FINDINGS: Cardiovascular: There is no large centrally located pulmonary embolus. Detection of smaller segmental and subsegmental pulmonary emboli is limited by contrast bolus timing. There is no thoracic aortic dissection.  Atherosclerotic changes are noted of the thoracic aorta. The thoracic aorta is normal in size. Coronary artery calcifications are noted. The heart size is normal. Mediastinum/Nodes: --No mediastinal or hilar lymphadenopathy. --No axillary lymphadenopathy. --No supraclavicular lymphadenopathy. --multiple thyroid nodules are noted bilaterally. --The esophagus is unremarkable Lungs/Pleura: There is a 6 mm pulmonary nodule in the right upper lobe (axial series 3, image 51). There is a perifissural plate like opacity involving the right middle lobe measuring approximately 9 mm (axial series 3, image 69). Given its appearance on the coronal view, this is almost certainly a benign process. There is no pneumothorax. No large pleural effusion. The trachea is essentially unremarkable however there does appear to be some debris within the left mainstem bronchus. Upper Abdomen: There is no acute abnormality. There is some mild thickening of both adrenal glands which is nonspecific. There is a focal outpouching of the proximal abdominal aorta posteriorly at the level of the celiac axis. This measures approximately 1.4 x 1 cm. The aorta at this level measures approximately 2 point 5 cm in diameter. Musculoskeletal: The patient is status post ORIF of the right humerus. There is a subacute appearing partially visualized impacted fracture of the proximal left humerus. There are multiple old left-sided rib fractures. There are superimposed acute fractures involving the ninth and tenth ribs posterolaterally on the left. Review of the MIP images confirms the above findings. IMPRESSION: 1. There is a 6 mm pulmonary nodule in the right upper lobe as detailed above. Non-contrast chest CT at 6-12 months is recommended. If the nodule is stable at time of repeat CT, then future CT at 18-24 months (from today's scan) is considered optional for low-risk patients, but is recommended for high-risk patients. This recommendation follows the  consensus statement: Guidelines for Management of Incidental Pulmonary Nodules Detected on CT Images: From the Fleischner Society 2017; Radiology 2017; 284:228-243. 2. Subacute appearing partially visualized impacted fracture of the proximal left humerus. Dedicated left shoulder radiographs may be useful for further evaluation of this finding. 3. Multiple acute and chronic left-sided rib fractures as detailed above. 4. There are bilateral thyroid nodules. These can be further evaluated with ultrasound as clinically indicated. 5. Focal outpouching of the posterior proximal abdominal aorta as detailed above. This is of doubtful clinical significance, however a follow-up CT a in 6 months should be considered to confirm stability of this finding. Aortic Atherosclerosis (ICD10-I70.0) and Emphysema (ICD10-J43.9). Electronically Signed   By: Katherine Mantlehristopher  Green M.D.   On: 12/16/2018 13:53   [place] Ekg Site Rite  Result Date: 12/16/2018 If Site Rite image not attached, placement could not be confirmed due to current cardiac rhythm.   Access: PICC Double Lumen              ASSESSMENT AND PLAN SYNOPSIS  78 y.o female with symptomatic hyponatremia and a history of diuretic use receiving hypertonic 3% solution.   Hyponatremia  -Nephrology consulted  -Cortisol level pending  -HCTZ / Neurontin discontinued -Hypertonic 3% Solution started 8/12 -Goal: 4-6 mEq/L per day -Continue to monitor electrolytes and replace PRN  Hypertension -amlodipine  Insomnia -Triazolam at bedtime PRN  CARDIAC ICU monitoring  GI GI PROPHYLAXIS as indicated  -Pantoprazole    NUTRITIONAL STATUS DIET--> Thin liquids  Constipation protocol as indicated  -Colace  ENDO - will use ICU hypoglycemic\Hyperglycemia protocol if needed   DVT/GI PRX ordered- heparin injection TRANSFUSIONS AS NEEDED MONITOR FSBS ASSESS the need for LABS   Hervey ArdJulie McCarter PA-S    Attestation: The patient was seen and examined  independently as well as with Hervey ArdJulie McCarter, PA-S, who is acting as my scribe.  See assessment and plan as above.  The patient has been admitted with significant hyponatremia and is being evaluated by nephrology for assistance in management of the same.  A CT scan of the chest was obtained to exclude potential lung mass.  The patient has a 6 mm nodule on the right upper lobe which is not amenable for biopsy due to small size and will need follow-up with reimaging in 6 to 12 months.  This is not likely because her hyponatremia.  Potentially offending medications have been discontinued namely hydrochlorothiazide and Neurontin.  Continue 3% saline's infusion as per guidelines provided by nephrology.  Gailen Shelter. Laura Terron Merfeld, MD [facility]

## 2018-12-16 NOTE — Progress Notes (Signed)
Peripherally Inserted Central Catheter/Midline Placement  The IV Nurse has discussed with the patient and/or persons authorized to consent for the patient, the purpose of this procedure and the potential benefits and risks involved with this procedure.  The benefits include less needle sticks, lab draws from the catheter, and the patient may be discharged home with the catheter. Risks include, but not limited to, infection, bleeding, blood clot (thrombus formation), and puncture of an artery; nerve damage and irregular heartbeat and possibility to perform a PICC exchange if needed/ordered by physician.  Alternatives to this procedure were also discussed.  Bard Power PICC patient education guide, fact sheet on infection prevention and patient information card has been provided to patient /or left at bedside.    PICC/Midline Placement Documentation  PICC Double Lumen 93/90/30 PICC Right Basilic 37 cm 0 cm (Active)  Indication for Insertion or Continuance of Line Administration of hyperosmolar/irritating solutions (i.e. TPN, Vancomycin, etc.) 12/16/18 1441  Exposed Catheter (cm) 0 cm 12/16/18 1441  Site Assessment Clean;Dry;Intact 12/16/18 1441  Lumen #1 Status Flushed;Blood return noted 12/16/18 1441  Lumen #2 Status Flushed;Blood return noted 12/16/18 1441  Dressing Type Transparent 12/16/18 1441  Dressing Status Clean;Dry;Intact;Antimicrobial disc in place 12/16/18 1441  Dressing Intervention New dressing 12/16/18 1441  Dressing Change Due 12/23/18 12/16/18 1441   Husband at bedside and understand risk and benifits    Synthia Innocent 12/16/2018, 2:41 PM

## 2018-12-16 NOTE — Progress Notes (Signed)
Pharmacy Sodium Monitoring Consult:  Pharmacy consulted to assist in monitoring Sodium in this 78 y.o. female admitted on 12/15/2018 with hyponatremia. HCTZ and gabapentin have been discontinued. NS transitioned to hypertonic saline.   Labs:  Sodium (mmol/L)  Date Value  12/16/2018 119 (LL)   Potassium (mmol/L)  Date Value  12/16/2018 3.6   Magnesium (mg/dL)  Date Value  12/15/2018 1.6 (L)   Phosphorus (mg/dL)  Date Value  12/15/2018 2.5   Calcium (mg/dL)  Date Value  12/16/2018 8.3 (L)   Albumin (g/dL)  Date Value  12/15/2018 4.5    Assessment/Plan: Goal replacement in 24 hours is sodium of 126. Sodium levels ordered Q4hrs.   Pharmacy will continue to monitor per consult.   Simpson,Michael L 12/16/2018 5:06 PM

## 2018-12-16 NOTE — Progress Notes (Signed)
[place] [facility] [place] NAME: Ruth Ramirez    MR#:  [id number]  DATE OF BIRTH:  10/31/1940  SUBJECTIVE:   Patient presented to the hospital due to weakness and altered mental status and noted to have hyponatremia.  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  REVIEW OF SYSTEMS:    Review of Systems  Constitutional: Negative for chills and fever.  HENT: Negative for congestion and tinnitus.   Eyes: Negative for blurred vision and double vision.  Respiratory: Negative for cough, shortness of breath and wheezing.   Cardiovascular: Negative for chest pain, orthopnea and PND.  Gastrointestinal: Negative for abdominal pain, diarrhea, nausea and vomiting.  Genitourinary: Negative for dysuria and hematuria.  Neurological: Positive for weakness (generalized). Negative for dizziness, sensory change and focal weakness.  All other systems reviewed and are negative.   Nutrition: Heart Healthy Tolerating Diet: Yes Tolerating PT: Await Eval.   DRUG ALLERGIES:   Allergies  Allergen Reactions   Morphine And Related Anxiety    VITALS:  Blood pressure (!) 161/65, pulse 81, temperature 98.3 F (36.8 C), temperature source Oral, resp. rate 15, height 5\' 2"  (1.575 m), weight 57.4 kg, SpO2 97 %.  PHYSICAL EXAMINATION:   Physical Exam  GENERAL:  78 y.o.-year-old patient lying in bed in no acute distress.  EYES: Pupils equal, round, reactive to light and accommodation. No scleral icterus. Extraocular muscles intact.  HEENT: Head atraumatic, normocephalic. Oropharynx and nasopharynx clear.  NECK:  Supple, no jugular venous distention. No thyroid enlargement, no tenderness.  LUNGS: Normal breath sounds bilaterally, no wheezing, rales, rhonchi. No use of accessory muscles of respiration.  CARDIOVASCULAR: S1, S2 normal. No murmurs, rubs, or gallops.  ABDOMEN: Soft, nontender, nondistended.  Bowel sounds present. No organomegaly or mass.  EXTREMITIES: No cyanosis, clubbing or edema b/l.    NEUROLOGIC: Cranial nerves II through XII are intact. No focal Motor or sensory deficits b/l. Globally weak.    PSYCHIATRIC: The patient is alert and oriented x 3.  SKIN: No obvious rash, lesion, or ulcer.    LABORATORY PANEL:   CBC Recent Labs  Lab 12/16/18 0437  WBC 6.6  HGB 13.2  HCT 36.2  PLT 305   ------------------------------------------------------------------------------------------------------------------  Chemistries  Recent Labs  Lab 12/15/18 1808 12/16/18 0437  12/16/18 1300  NA 116* 117*   < > 119*  K 4.3 3.6  --   --   CL 79* 87*  --   --   CO2 23 21*  --   --   GLUCOSE 139* 112*  --   --   BUN 9 8  --   --   CREATININE 0.79 0.79  --   --   CALCIUM 9.3 8.3*  --   --   MG 1.6*  --   --   --   AST 28  --   --   --   ALT 14  --   --   --   ALKPHOS 106  --   --   --   BILITOT 0.4  --   --   --    < > = values in this interval not displayed.   ------------------------------------------------------------------------------------------------------------------  Cardiac Enzymes No results for input(s): TROPONINI in the last 168 hours. ------------------------------------------------------------------------------------------------------------------  RADIOLOGY:  Dg Chest 1 View  Result Date: 12/15/2018 CLINICAL DATA:  Weakness EXAM: CHEST  1 VIEW COMPARISON:  Radiographs December 19, 2017 FINDINGS: Coarse interstitial changes are similar to prior though accentuated by the portable technique. No consolidation, features of edema, pneumothorax, or effusion. Pulmonary vascularity is normally distributed. The aorta is calcified and tortuous. Cardiomediastinal contours are otherwise stable. No acute osseous or soft tissue abnormality. Multiple remote left rib fractures are again seen. A plate and screw fixation is seen along the proximal right humerus with additional  surgical pin. IMPRESSION: No acute cardiopulmonary abnormality Electronically Signed   By: Kreg ShropshirePrice  DeHay M.D.   On: 12/15/2018 21:16   Ct Chest W Contrast  Result Date: 12/16/2018 CLINICAL DATA:  Hyponatremia.  Concern for lung mass. EXAM: CT CHEST WITH CONTRAST TECHNIQUE: Multidetector CT imaging of the chest was performed during intravenous contrast administration. CONTRAST:  75mL OMNIPAQUE IOHEXOL 300 MG/ML  SOLN COMPARISON:  None. FINDINGS: Cardiovascular: There is no large centrally located pulmonary embolus. Detection of smaller segmental and subsegmental pulmonary emboli is limited by contrast bolus timing. There is no thoracic aortic dissection. Atherosclerotic changes are noted of the thoracic aorta. The thoracic aorta is normal in size. Coronary artery calcifications are noted. The heart size is normal. Mediastinum/Nodes: --No mediastinal or hilar lymphadenopathy. --No axillary lymphadenopathy. --No supraclavicular lymphadenopathy. --multiple thyroid nodules are noted bilaterally. --The esophagus is unremarkable Lungs/Pleura: There is a 6 mm pulmonary nodule in the right upper lobe (axial series 3, image 51). There is a perifissural plate like opacity involving the right middle lobe measuring approximately 9 mm (axial series 3, image 69). Given its appearance on the coronal view, this is almost certainly a benign process. There is no pneumothorax. No large pleural effusion. The trachea is essentially unremarkable however there does appear to be some debris within the left mainstem bronchus. Upper Abdomen: There is no acute abnormality. There is some mild thickening of both adrenal glands which is nonspecific. There is a focal outpouching of the proximal abdominal aorta posteriorly at the level of the celiac axis. This measures approximately 1.4 x 1 cm. The aorta at this level measures approximately 2 point 5 cm in diameter. Musculoskeletal: The patient is status post ORIF of the right humerus. There is  a subacute appearing partially visualized impacted fracture of the proximal left humerus. There are multiple old left-sided rib fractures. There are superimposed acute fractures involving the ninth and tenth ribs posterolaterally on the left. Review of the MIP images confirms the above findings. IMPRESSION: 1. There is a 6 mm pulmonary nodule in the right upper lobe as detailed above. Non-contrast chest CT at 6-12 months is recommended. If the nodule is stable at time of repeat CT, then future CT at 18-24 months (from today's scan) is considered optional for low-risk patients, but is recommended for high-risk patients. This recommendation follows the consensus statement: Guidelines for Management of Incidental Pulmonary Nodules Detected on CT Images: From the Fleischner Society 2017; Radiology 2017; 284:228-243. 2. Subacute appearing partially visualized impacted fracture of the proximal left humerus. Dedicated left shoulder radiographs may be useful for further evaluation of this finding. 3. Multiple acute and chronic left-sided rib fractures as detailed above. 4. There are bilateral thyroid nodules. These can be further evaluated with ultrasound as clinically indicated. 5. Focal outpouching of the posterior proximal abdominal aorta as detailed above. This is of doubtful clinical significance, however a follow-up CT a in 6 months should be considered to confirm stability of this finding. Aortic Atherosclerosis (ICD10-I70.0) and Emphysema (ICD10-J43.9). Electronically Signed   By: Katherine Mantlehristopher  Green M.D.   On: 12/16/2018 13:53  [place] Ekg Site Rite  Result Date: 12/16/2018 If Site Rite image not attached, placement could not be confirmed due to current cardiac rhythm.    ASSESSMENT AND PLAN:   78 year old female with past medical history of central hypertension, hyperlipidemia, history of previous CVA, GERD, depression who presented to the hospital due to weakness and altered mental status and noted to be  hyponatremic.  1.  Acute hyponatremia- patient presented to the hospital with sodium of 117. 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  Did not improve with normal saline, transferred to ICU PICC line placed and started on 3% saline. -Appreciate nephrology input.  CT chest showing no evidence of lung mass or malignancy presently. -Follow sodium, goal for over the next 24 hours if sodium up to 126. -Continue to hold hydrochlorothiazide for now  2.  Essential hypertension-continue lisinopril, Norvasc.  3.  History of previous CVA-continue aspirin, Plavix.  4.  Osteoporosis-continue calcium vitamin D supplements.  5.  GERD-continue Protonix.   All the records are reviewed and case discussed with Care Management/Social Worker. Management plans discussed with the patient, family and they are in agreement.  CODE STATUS: Full code  DVT Prophylaxis: Heparin subcu  TOTAL TIME TAKING CARE OF THIS PATIENT: 30 minutes.   POSSIBLE D/C IN 2-3 DAYS, DEPENDING ON CLINICAL CONDITION.   Houston SirenVivek J Harjit Leider M.D on 12/16/2018 at 4:00 PM  Between 7am to 6pm - Pager - [phone]  After 6pm go to [email or web address] - Social research officer, governmentpassword [email or web address]  [facility]  Office  [phone]  CC: Primary care physician; Marina GoodellFeldpausch, Dale E, MD

## 2018-12-16 NOTE — Progress Notes (Signed)
Pt was started on 3% Hypertonic saline this afternoon at around approximately 1500 (Na at that time was 119).  Na check at 1708 was 136, therefore STAT recheck ordered, and recheck Na is 123.  Approximately hourly rate of correction so far is 0.66.  Will decrease rate of 3% saline infusion to 15 ml/hr to try and obtain correction rate of approximately 0.33 per hour.  Recheck Na in 4 hrs.  Goal Na is 126 as per Nephrology recommendation within 1st 24 hrs.   Darel Hong, AGACNP-BC [facility] Pager: [phone] Cell: [phone]

## 2018-12-17 DIAGNOSIS — R531 Weakness: Secondary | ICD-10-CM

## 2018-12-17 DIAGNOSIS — R410 Disorientation, unspecified: Secondary | ICD-10-CM

## 2018-12-17 LAB — PHOSPHORUS: Phosphorus: 3.4 mg/dL (ref 2.5–4.6)

## 2018-12-17 LAB — BASIC METABOLIC PANEL
Anion gap: 9 (ref 5–15)
BUN: 8 mg/dL (ref 8–23)
CO2: 22 mmol/L (ref 22–32)
Calcium: 8.6 mg/dL — ABNORMAL LOW (ref 8.9–10.3)
Chloride: 95 mmol/L — ABNORMAL LOW (ref 98–111)
Creatinine, Ser: 0.75 mg/dL (ref 0.44–1.00)
GFR calc Af Amer: >60 mL/min (ref >60)
GFR calc non Af Amer: >60 mL/min (ref >60)
Glucose, Bld: 88 mg/dL (ref 70–99)
Potassium: 3.6 mmol/L (ref 3.5–5.1)
Sodium: 126 mmol/L — ABNORMAL LOW (ref 135–145)

## 2018-12-17 LAB — SODIUM
Sodium: 126 mmol/L — ABNORMAL LOW (ref 135–145)
Sodium: 128 mmol/L — ABNORMAL LOW (ref 135–145)

## 2018-12-17 LAB — MAGNESIUM: Magnesium: 2.1 mg/dL (ref 1.7–2.4)

## 2018-12-17 LAB — CORTISOL: Cortisol, Plasma: 10.4 ug/dL

## 2018-12-17 MED ORDER — SODIUM CHLORIDE 1 G PO TABS
1.0000 g | ORAL_TABLET | Freq: Two times a day (BID) | ORAL | Status: DC
  Administered 2018-12-17 – 2018-12-19 (×4): 1 g via ORAL
  Filled 2018-12-17 (×6): qty 1

## 2018-12-17 NOTE — Progress Notes (Signed)
[facility] [place] NAME: Ruth Ramirez    MR#:  [id number]  DATE OF BIRTH:  [birth year]  SUBJECTIVE:   Sodium much improved after 3% saline up to goal at 128 today.  Patient still complaining of some generalized weakness but no other acute events overnight or complaints presently.  Plan to transfer to the floor again today.  REVIEW OF SYSTEMS:    Review of Systems  Constitutional: Negative for chills and fever.  HENT: Negative for congestion and tinnitus.   Eyes: Negative for blurred vision and double vision.  Respiratory: Negative for cough, shortness of breath and wheezing.   Cardiovascular: Negative for chest pain, orthopnea and PND.  Gastrointestinal: Negative for abdominal pain, diarrhea, nausea and vomiting.  Genitourinary: Negative for dysuria and hematuria.  Neurological: Positive for weakness (generalized). Negative for dizziness, sensory change and focal weakness.  All other systems reviewed and are negative.   Nutrition: Heart Healthy Tolerating Diet: Yes Tolerating PT: Await Eval.   DRUG ALLERGIES:   Allergies  Allergen Reactions   Morphine And Related Anxiety    VITALS:  Blood pressure (!) 175/91, pulse 90, temperature 97.6 F (36.4 C), resp. rate (!) 22, height 5\' 2"  (1.575 m), weight 57.4 kg, SpO2 95 %.  PHYSICAL EXAMINATION:   Physical Exam  GENERAL:  78 y.o.-year-old patient lying in bed in no acute distress.  EYES: Pupils equal, round, reactive to light and accommodation. No scleral icterus. Extraocular muscles intact.  HEENT: Head atraumatic, normocephalic. Oropharynx and nasopharynx clear.  NECK:  Supple, no jugular venous distention. No thyroid enlargement, no tenderness.  LUNGS: Normal breath sounds bilaterally, no wheezing, rales, rhonchi. No use of accessory muscles of respiration.  CARDIOVASCULAR: S1, S2 normal. No murmurs, rubs, or gallops.  ABDOMEN: Soft, nontender, nondistended. Bowel sounds  present. No organomegaly or mass.  EXTREMITIES: No cyanosis, clubbing or edema b/l.    NEUROLOGIC: Cranial nerves II through XII are intact. No focal Motor or sensory deficits b/l. Globally weak.    PSYCHIATRIC: The patient is alert and oriented x 3.  SKIN: No obvious rash, lesion, or ulcer.    LABORATORY PANEL:   CBC Recent Labs  Lab 12/16/18 0437  WBC 6.6  HGB 13.2  HCT 36.2  PLT 305   ------------------------------------------------------------------------------------------------------------------  Chemistries  Recent Labs  Lab 12/15/18 1808  12/17/18 0334 12/17/18 0748  NA 116*   < > 126* 128*  K 4.3   < > 3.6  --   CL 79*   < > 95*  --   CO2 23   < > 22  --   GLUCOSE 139*   < > 88  --   BUN 9   < > 8  --   CREATININE 0.79   < > 0.75  --   CALCIUM 9.3   < > 8.6*  --   MG 1.6*  --  2.1  --   AST 28  --   --   --   ALT 14  --   --   --   ALKPHOS 106  --   --   --   BILITOT 0.4  --   --   --    < > = values in this interval not displayed.   ------------------------------------------------------------------------------------------------------------------  Cardiac Enzymes No results for input(s): TROPONINI in the last 168 hours. ------------------------------------------------------------------------------------------------------------------  RADIOLOGY:  Dg Chest 1 View  Result Date: 12/15/2018 CLINICAL DATA:  Weakness EXAM:  CHEST  1 VIEW COMPARISON:  Radiographs December 19, 2017 FINDINGS: Coarse interstitial changes are similar to prior though accentuated by the portable technique. No consolidation, features of edema, pneumothorax, or effusion. Pulmonary vascularity is normally distributed. The aorta is calcified and tortuous. Cardiomediastinal contours are otherwise stable. No acute osseous or soft tissue abnormality. Multiple remote left rib fractures are again seen. A plate and screw fixation is seen along the proximal right humerus with additional surgical pin.  IMPRESSION: No acute cardiopulmonary abnormality Electronically Signed   By: Kreg ShropshirePrice  DeHay M.D.   On: 12/15/2018 21:16   Ct Chest W Contrast  Result Date: 12/16/2018 CLINICAL DATA:  Hyponatremia.  Concern for lung mass. EXAM: CT CHEST WITH CONTRAST TECHNIQUE: Multidetector CT imaging of the chest was performed during intravenous contrast administration. CONTRAST:  75mL OMNIPAQUE IOHEXOL 300 MG/ML  SOLN COMPARISON:  None. FINDINGS: Cardiovascular: There is no large centrally located pulmonary embolus. Detection of smaller segmental and subsegmental pulmonary emboli is limited by contrast bolus timing. There is no thoracic aortic dissection. Atherosclerotic changes are noted of the thoracic aorta. The thoracic aorta is normal in size. Coronary artery calcifications are noted. The heart size is normal. Mediastinum/Nodes: --No mediastinal or hilar lymphadenopathy. --No axillary lymphadenopathy. --No supraclavicular lymphadenopathy. --multiple thyroid nodules are noted bilaterally. --The esophagus is unremarkable Lungs/Pleura: There is a 6 mm pulmonary nodule in the right upper lobe (axial series 3, image 51). There is a perifissural plate like opacity involving the right middle lobe measuring approximately 9 mm (axial series 3, image 69). Given its appearance on the coronal view, this is almost certainly a benign process. There is no pneumothorax. No large pleural effusion. The trachea is essentially unremarkable however there does appear to be some debris within the left mainstem bronchus. Upper Abdomen: There is no acute abnormality. There is some mild thickening of both adrenal glands which is nonspecific. There is a focal outpouching of the proximal abdominal aorta posteriorly at the level of the celiac axis. This measures approximately 1.4 x 1 cm. The aorta at this level measures approximately 2 point 5 cm in diameter. Musculoskeletal: The patient is status post ORIF of the right humerus. There is a subacute  appearing partially visualized impacted fracture of the proximal left humerus. There are multiple old left-sided rib fractures. There are superimposed acute fractures involving the ninth and tenth ribs posterolaterally on the left. Review of the MIP images confirms the above findings. IMPRESSION: 1. There is a 6 mm pulmonary nodule in the right upper lobe as detailed above. Non-contrast chest CT at 6-12 months is recommended. If the nodule is stable at time of repeat CT, then future CT at 18-24 months (from today's scan) is considered optional for low-risk patients, but is recommended for high-risk patients. This recommendation follows the consensus statement: Guidelines for Management of Incidental Pulmonary Nodules Detected on CT Images: From the Fleischner Society 2017; Radiology 2017; 284:228-243. 2. Subacute appearing partially visualized impacted fracture of the proximal left humerus. Dedicated left shoulder radiographs may be useful for further evaluation of this finding. 3. Multiple acute and chronic left-sided rib fractures as detailed above. 4. There are bilateral thyroid nodules. These can be further evaluated with ultrasound as clinically indicated. 5. Focal outpouching of the posterior proximal abdominal aorta as detailed above. This is of doubtful clinical significance, however a follow-up CT a in 6 months should be considered to confirm stability of this finding. Aortic Atherosclerosis (ICD10-I70.0) and Emphysema (ICD10-J43.9). Electronically Signed   By: Katherine Mantlehristopher  Green  M.D.   On: 12/16/2018 13:53   [place] Ekg Site Rite  Result Date: 12/16/2018 If Site Rite image not attached, placement could not be confirmed due to current cardiac rhythm.    ASSESSMENT AND PLAN:   78 year old female with past medical history of central hypertension, hyperlipidemia, history of previous CVA, GERD, depression who presented to the hospital due to weakness and altered mental status and noted to be  hyponatremic.  1.  Acute hyponatremia- patient presented to the hospital with sodium of 117. 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  Did not improve with normal saline, transferred to ICU PICC line placed and started on 3% saline. -Sodium up to 128 and at goal.  Appreciate nephrology input.  Started on salt tabs with fluid restriction.  CT chest was negative for lung mass but showed a pulmonary nodule. -Continue to hold hydrochlorothiazide for now  2.  Essential hypertension-continue lisinopril, Norvasc. -Blood pressure stable  3.  History of previous CVA-continue aspirin, Plavix.  4.  Osteoporosis-continue calcium vitamin D supplements.  5.  GERD-continue Protonix.  We will transfer to floor, get physical therapy evaluation.  Discussed plan of care with patient's husband over the phone.  All the records are reviewed and case discussed with Care Management/Social Worker. Management plans discussed with the patient, family and they are in agreement.  CODE STATUS: Full code  DVT Prophylaxis: Heparin subcu  TOTAL TIME TAKING CARE OF THIS PATIENT: 30 minutes.   POSSIBLE D/C IN 1-2 DAYS, DEPENDING ON CLINICAL CONDITION.   Houston SirenVivek J Vara Mairena M.D on 12/17/2018 at 3:26 PM  Between 7am to 6pm - Pager - [phone]  After 6pm go to [email or web address] - Social research officer, governmentpassword EPAS [facility]  [facility]  Office  [phone]  CC: Primary care physician; Marina GoodellFeldpausch, Dale E, MD

## 2018-12-17 NOTE — Progress Notes (Signed)
Up in room and with PT. Dr.Sainani refused to let patient go home. Still complains of generalized weakness. Eats very little.Husband in to visit.

## 2018-12-17 NOTE — Progress Notes (Signed)
Pharmacy Sodium Monitoring Consult:  Pharmacy consulted to assist in monitoring Sodium in this 78 y.o. female admitted on 12/15/2018 with hyponatremia. HCTZ and gabapentin have been discontinued. NS transitioned to hypertonic saline.   Labs:  Sodium (mmol/L)  Date Value  12/17/2018 126 (L)   Potassium (mmol/L)  Date Value  12/17/2018 3.6   Magnesium (mg/dL)  Date Value  12/17/2018 2.1   Phosphorus (mg/dL)  Date Value  12/17/2018 3.4   Calcium (mg/dL)  Date Value  12/17/2018 8.6 (L)   Albumin (g/dL)  Date Value  12/15/2018 4.5    Assessment/Plan: 08/13 @ 0330 Na 126 WNL trending appropriately. Will continue to monitor.   Pharmacy will continue to monitor per consult.   Tobie Lords, PharmD, BCPS Clinical Pharmacist 12/17/2018 4:38 AM

## 2018-12-17 NOTE — Progress Notes (Addendum)
Good day. Up in room and walked without assistance with physical therapy. Dr. Verdell Carmine did not choose to allow patient to go home. Medicated for headache x 1. Dr.Sainani talked with husband and couple is more satisified with staying another night. No floor beds available to move patient.  Dr. Holley Raring and Dr. Duwayne Heck agree that sodium levels are adequate. Patient less confused but forgetful. ( patient is 78 years old) Appetite is poor but patient reports this is not a new problem since her CVA in 2017. She also has a history of multiple falls.

## 2018-12-17 NOTE — Progress Notes (Signed)
Pharmacy Sodium Monitoring Consult:  Pharmacy consulted to assist in monitoring Sodium in this 78 y.o. female admitted on 12/15/2018 with hyponatremia. HCTZ and gabapentin have been discontinued. NS transitioned to hypertonic saline.   Labs:  Sodium (mmol/L)  Date Value  12/16/2018 126 (L)   Potassium (mmol/L)  Date Value  12/16/2018 3.6   Magnesium (mg/dL)  Date Value  12/15/2018 1.6 (L)   Phosphorus (mg/dL)  Date Value  12/15/2018 2.5   Calcium (mg/dL)  Date Value  12/16/2018 8.3 (L)   Albumin (g/dL)  Date Value  12/15/2018 4.5    Assessment/Plan: 08/12 @ 2330 Na 126 WNL trending appropriately. Will continue to monitor.   Pharmacy will continue to monitor per consult.   Tobie Lords, PharmD, BCPS Clinical Pharmacist 12/17/2018 2:42 AM

## 2018-12-17 NOTE — Progress Notes (Signed)
[facility]  ROUNDING NOTE   Subjective:  Patient seen at bedside in ICU. Serum sodium up to 128. Patient currently off of 3% saline.   Objective:  Vital signs in last 24 hours:  Temp:  [98 F (36.7 C)-98.7 F (37.1 C)] 98.7 F (37.1 C) (08/13 0700) Pulse Rate:  [77-98] 83 (08/13 1000) Resp:  [12-22] 13 (08/13 1000) BP: (77-161)/(41-107) 143/107 (08/13 1000) SpO2:  [93 %-98 %] 96 % (08/13 1000) Weight:  [57.4 kg] 57.4 kg (08/12 1450)  Weight change: -3.836 kg Filed Weights   12/15/18 1800 12/16/18 1450  Weight: 61.2 kg 57.4 kg    Intake/Output: I/O last 3 completed shifts: In: 1970.3 [P.O.:480; I.V.:1440.3; IV Piggyback:50] Out: 1350 [Urine:1350]   Intake/Output this shift:  Total I/O In: -  Out: 100 [Urine:100]  Physical Exam: General: No acute distress  Head: Normocephalic, atraumatic. Moist oral mucosal membranes  Eyes: Anicteric  Neck: Supple, trachea midline  Lungs:  Clear to auscultation, normal effort  Heart: S1S2 no rubs  Abdomen:  Soft, nontender, bowel sounds present  Extremities: No peripheral edema.  Neurologic: Awake, alert, following commands  Skin: No lesions       Basic Metabolic Panel: Recent Labs  Lab 12/15/18 1808 12/16/18 0437  12/16/18 1919 12/16/18 1956 12/16/18 2341 12/17/18 0334 12/17/18 0748  NA 116* 117*   < > 123* 123* 126* 126* 128*  K 4.3 3.6  --   --   --   --  3.6  --   CL 79* 87*  --   --   --   --  95*  --   CO2 23 21*  --   --   --   --  22  --   GLUCOSE 139* 112*  --   --   --   --  88  --   BUN 9 8  --   --   --   --  8  --   CREATININE 0.79 0.79  --   --   --   --  0.75  --   CALCIUM 9.3 8.3*  --   --   --   --  8.6*  --   MG 1.6*  --   --   --   --   --  2.1  --   PHOS 2.5  --   --   --   --   --  3.4  --    < > = values in this interval not displayed.    Liver Function Tests: Recent Labs  Lab 12/15/18 1808  AST 28  ALT 14  ALKPHOS 106  BILITOT 0.4  PROT 7.9  ALBUMIN 4.5   No  results for input(s): LIPASE, AMYLASE in the last 168 hours. No results for input(s): AMMONIA in the last 168 hours.  CBC: Recent Labs  Lab 12/15/18 1808 12/16/18 0437  WBC 6.7 6.6  HGB 14.8 13.2  HCT 40.6 36.2  MCV 81.5 81.3  PLT 334 305    Cardiac Enzymes: No results for input(s): CKTOTAL, CKMB, CKMBINDEX, TROPONINI in the last 168 hours.  BNP: Invalid input(s): POCBNP  CBG: Recent Labs  Lab 12/16/18 1453  GLUCAP 113*    Microbiology: Results for orders placed or performed during the hospital encounter of 12/15/18  SARS CORONAVIRUS 2 Nasal Swab Aptima Multi Swab     Status: None   Collection Time: 12/15/18  7:03 PM   Specimen: Aptima Multi Swab; Nasal Swab  Result Value Ref Range Status   SARS Coronavirus 2 NEGATIVE NEGATIVE Final    Comment: (NOTE) SARS-CoV-2 target nucleic acids are NOT DETECTED. The SARS-CoV-2 RNA is generally detectable in upper and lower respiratory specimens during the acute phase of infection. Negative results do not preclude SARS-CoV-2 infection, do not rule out co-infections with other pathogens, and should not be used as the sole basis for treatment or other patient management decisions. Negative results must be combined with clinical observations, patient history, and epidemiological information. The expected result is Negative. Fact Sheet for Patients: HairSlick.nohttps://www.fda.gov/media/138098/download Fact Sheet for Healthcare Providers: quierodirigir.comhttps://www.fda.gov/media/138095/download This test is not yet approved or cleared by the [place] FDA and  has been authorized for detection and/or diagnosis of SARS-CoV-2 by FDA under an Emergency Use Authorization (EUA). This EUA will remain  in effect (meaning this test can be used) for the duration of the COVID-19 declaration under Section 56 4(b)(1) of the Act, 21 U.S.C. section 360bbb-3(b)(1), unless the authorization is terminated or revoked sooner. Performed at [facility], [address] [place], [place] [address]   MRSA PCR Screening     Status: None   Collection Time: 12/16/18  2:59 PM   Specimen: Nasopharyngeal  Result Value Ref Range Status   MRSA by PCR NEGATIVE NEGATIVE Final    Comment:        The GeneXpert MRSA Assay (FDA approved for NASAL specimens only), is one component of a comprehensive MRSA colonization surveillance program. It is not intended to diagnose MRSA infection nor to guide or monitor treatment for MRSA infections. Performed at [facility], [address] [place], [place] [address]     Coagulation Studies: No results for input(s): LABPROT, INR in the last 72 hours.  Urinalysis: Recent Labs    12/15/18 1834  COLORURINE YELLOW*  LABSPEC 1.012  PHURINE 7.0  GLUCOSEU NEGATIVE  HGBUR NEGATIVE  BILIRUBINUR NEGATIVE  KETONESUR NEGATIVE  PROTEINUR 100*  NITRITE NEGATIVE  LEUKOCYTESUR TRACE*      Imaging: Dg Chest 1 View  Result Date: 12/15/2018 CLINICAL DATA:  Weakness EXAM: CHEST  1 VIEW COMPARISON:  Radiographs December 19, 2017 FINDINGS: Coarse interstitial changes are similar to prior though accentuated by the portable technique. No consolidation, features of edema, pneumothorax, or effusion. Pulmonary vascularity is normally distributed. The aorta is calcified and tortuous. Cardiomediastinal contours are otherwise stable. No acute osseous or soft tissue abnormality. Multiple remote left rib fractures are again seen. A plate and screw fixation is seen along the proximal right humerus with additional surgical pin. IMPRESSION: No acute cardiopulmonary abnormality Electronically Signed   By: Kreg ShropshirePrice  DeHay M.D.   On: 12/15/2018 21:16   Ct Chest W Contrast  Result Date: 12/16/2018 CLINICAL DATA:  Hyponatremia.  Concern for lung mass. EXAM: CT CHEST WITH CONTRAST TECHNIQUE: Multidetector CT imaging of the chest was performed during intravenous contrast administration. CONTRAST:  75mL OMNIPAQUE IOHEXOL 300 MG/ML  SOLN  COMPARISON:  None. FINDINGS: Cardiovascular: There is no large centrally located pulmonary embolus. Detection of smaller segmental and subsegmental pulmonary emboli is limited by contrast bolus timing. There is no thoracic aortic dissection. Atherosclerotic changes are noted of the thoracic aorta. The thoracic aorta is normal in size. Coronary artery calcifications are noted. The heart size is normal. Mediastinum/Nodes: --No mediastinal or hilar lymphadenopathy. --No axillary lymphadenopathy. --No supraclavicular lymphadenopathy. --multiple thyroid nodules are noted bilaterally. --The esophagus is unremarkable Lungs/Pleura: There is a 6 mm pulmonary nodule in the right upper lobe (axial series  3, image 51). There is a perifissural plate like opacity involving the right middle lobe measuring approximately 9 mm (axial series 3, image 69). Given its appearance on the coronal view, this is almost certainly a benign process. There is no pneumothorax. No large pleural effusion. The trachea is essentially unremarkable however there does appear to be some debris within the left mainstem bronchus. Upper Abdomen: There is no acute abnormality. There is some mild thickening of both adrenal glands which is nonspecific. There is a focal outpouching of the proximal abdominal aorta posteriorly at the level of the celiac axis. This measures approximately 1.4 x 1 cm. The aorta at this level measures approximately 2 point 5 cm in diameter. Musculoskeletal: The patient is status post ORIF of the right humerus. There is a subacute appearing partially visualized impacted fracture of the proximal left humerus. There are multiple old left-sided rib fractures. There are superimposed acute fractures involving the ninth and tenth ribs posterolaterally on the left. Review of the MIP images confirms the above findings. IMPRESSION: 1. There is a 6 mm pulmonary nodule in the right upper lobe as detailed above. Non-contrast chest CT at 6-12  months is recommended. If the nodule is stable at time of repeat CT, then future CT at 18-24 months (from today's scan) is considered optional for low-risk patients, but is recommended for high-risk patients. This recommendation follows the consensus statement: Guidelines for Management of Incidental Pulmonary Nodules Detected on CT Images: From the Fleischner Society 2017; Radiology 2017; 284:228-243. 2. Subacute appearing partially visualized impacted fracture of the proximal left humerus. Dedicated left shoulder radiographs may be useful for further evaluation of this finding. 3. Multiple acute and chronic left-sided rib fractures as detailed above. 4. There are bilateral thyroid nodules. These can be further evaluated with ultrasound as clinically indicated. 5. Focal outpouching of the posterior proximal abdominal aorta as detailed above. This is of doubtful clinical significance, however a follow-up CT a in 6 months should be considered to confirm stability of this finding. Aortic Atherosclerosis (ICD10-I70.0) and Emphysema (ICD10-J43.9). Electronically Signed   By: Katherine Mantlehristopher  Green M.D.   On: 12/16/2018 13:53   [place] Ekg Site Rite  Result Date: 12/16/2018 If Site Rite image not attached, placement could not be confirmed due to current cardiac rhythm.    Medications:    . amLODipine  2.5 mg Oral Daily  . aspirin EC  81 mg Oral Daily  . calcium-vitamin D  1 tablet Oral BID WC  . clopidogrel  75 mg Oral Daily  . heparin  5,000 Units Subcutaneous Q8H  . lisinopril  40 mg Oral Daily  . pantoprazole  40 mg Oral Daily  . sodium chloride flush  10-40 mL Intracatheter Q12H  . cyanocobalamin  2,000 mcg Oral Daily   acetaminophen, docusate sodium, sodium chloride flush, triazolam  Assessment/ Plan:  78 y.o. female with a PMHx of carotid artery stenosis, depression, GERD, history of CVA, hyperlipidemia, hypertension, insomnia, tobacco abuse, who was [facility]15[facility]2018 for evaluation of  altered mental status and abnormal labs.   1. Hyponatremia. 2.  Small pulmonary nodule. 3.  Hypertension.  Plan:  Patient seen and evaluated at bedside.  Serum sodium up to 128.  Start the patient on sodium chloride 1 g p.o. twice daily and place the patient on 1200 cc fluid restriction.  We may need to add low-dose furosemide as well but will hold off for now.  Patient has small 6 mm lung nodule that will require  further imaging in the future as well.  Further plan as patient progresses.   LOS: 2 Ruth Ramirez 8/13/202011:30 AM

## 2018-12-17 NOTE — Progress Notes (Signed)
Follow up Na is 126 (which is goal).  Will stop 3% saline.  Discussed with nursing.   Darel Hong, AGACNP-BC [facility] Pager: [phone] Cell: [phone]

## 2018-12-17 NOTE — Progress Notes (Signed)
Report called to [facility] RN on 1C.

## 2018-12-17 NOTE — Evaluation (Signed)
Physical Therapy Evaluation Patient Details Name: Ruth Ramirez MRN: [id number] DOB: 03/27/1941 Today's Date: 12/17/2018   History of Present Illness  Pt admitted for hyponatremia with complaints of confusion and HA. History includes CAD, depression, GERD, CVA, and HTN.   Clinical Impression  Pt is a pleasant 78 year old female who was admitted for hyponatremia. Pt performs bed mobility with independence and transfers/ambulation with cga and no AD.  Pt demonstrates all bed mobility/transfers/ambulation at baseline level. Pt does not require any further PT needs at this time. Pt will be dc in house and does not require follow up. RN aware. Will dc current orders.     Follow Up Recommendations No PT follow up    Equipment Recommendations  None recommended by PT    Recommendations for Other Services       Precautions / Restrictions Precautions Precautions: Fall Restrictions Weight Bearing Restrictions: No      Mobility  Bed Mobility Overal bed mobility: Independent             General bed mobility comments: safe technique performed without AD  Transfers Overall transfer level: Needs assistance Equipment used: None Transfers: Sit to/from Stand Sit to Stand: Min guard         General transfer comment: stood up easily with no dizziness. Steady without AD used.  Ambulation/Gait Ambulation/Gait assistance: Min guard Gait Distance (Feet): 150 Feet Assistive device: None Gait Pattern/deviations: Step-through pattern     General Gait Details: ambulated with reciprocal gait pattern. Navigates turns well without LOB. Slight fatigue noted, however doesn't affect mobility efforts. All vitals WNL with exertion  Stairs            Wheelchair Mobility    Modified Rankin (Stroke Patients Only)       Balance Overall balance assessment: History of Falls                                           Pertinent Vitals/Pain Pain Assessment: No/denies  pain    Home Living Family/patient expects to be discharged to:: Private residence Living Arrangements: Spouse/significant other Available Help at Discharge: Family Type of Home: House Home Access: Stairs to enter Entrance Stairs-Rails: Can reach both Entrance Stairs-Number of Steps: 3 Home Layout: One level Home Equipment: None      Prior Function Level of Independence: Independent               Hand Dominance        Extremity/Trunk Assessment   Upper Extremity Assessment Upper Extremity Assessment: Overall WFL for tasks assessed    Lower Extremity Assessment Lower Extremity Assessment: Overall WFL for tasks assessed       Communication   Communication: No difficulties  Cognition Arousal/Alertness: Awake/alert Behavior During Therapy: WFL for tasks assessed/performed Overall Cognitive Status: Within Functional Limits for tasks assessed                                        General Comments      Exercises Other Exercises Other Exercises: supine ther-ex performed on B LE including AP, SLRs, heel slides, and alt marching seated at bedside. All ther-ex performed x 10 reps with supervision   Assessment/Plan    PT Assessment Patent does not need any further PT services  PT Problem  List         PT Treatment Interventions      PT Goals (Current goals can be found in the Care Plan section)  Acute Rehab PT Goals Patient Stated Goal: to go home PT Goal Formulation: All assessment and education complete, DC therapy Time For Goal Achievement: 12/17/18 Potential to Achieve Goals: Good    Frequency     Barriers to discharge        Co-evaluation               AM-PAC PT "6 Clicks" Mobility  Outcome Measure Help needed turning from your back to your side while in a flat bed without using bedrails?: None Help needed moving from lying on your back to sitting on the side of a flat bed without using bedrails?: None Help needed moving  to and from a bed to a chair (including a wheelchair)?: None Help needed standing up from a chair using your arms (e.g., wheelchair or bedside chair)?: None Help needed to walk in hospital room?: None Help needed climbing 3-5 steps with a railing? : A Little 6 Click Score: 23    End of Session Equipment Utilized During Treatment: Gait belt Activity Tolerance: Patient tolerated treatment well Patient left: in bed Nurse Communication: Mobility status PT Visit Diagnosis: Muscle weakness (generalized) (M62.81)    Time: 1410-1431 PT Time Calculation (min) (ACUTE ONLY): 21 min   Charges:   PT Evaluation $PT Eval Low Complexity: 1 Low          Greggory Stallion, PT, DPT [phone]   Domenic Schoenberger 12/17/2018, 4:56 PM

## 2018-12-17 NOTE — Progress Notes (Signed)
Name: Ruth Ramirez MRN: [id number] DOB: 05/28/1940     CONSULTATION DATE: 12/15/2018  REFERRING MD :  Dr. Cherlynn KaiserSainani  CHIEF COMPLAINT:  Headache and confusion   HISTORY OF PRESENT ILLNESS: Ruth BimlerMary Ann Ramirez is a 78 y.o female with a past medical history of CAD, Hypertension, Hyperlipidemia, Hx of CVA, GERD, insomnia, and depression. Current smoker, described as a "heavy smoker" per husband, >20 pack year history. She also has a history of a recent, 2 months ago, fall with broken ribs and humerus per husband. She presented to the ED for hyponatremia. She was seen by her PCP today for worsening headache and confusion x 1wk when labs showed her sodium to be 118. In ED labs demonstrated further electrolyte imbalance of hypochloremia (78) and mild hypomagnesemia (1.6). TSH levels within normal range. Serum osmolality was 249 mOsm/kg. CXR also obtained in ED. She was admitted (8/11) and started on IV fluids, normal saline and her hydrochlorothiazide was stopped as this is a suspected cause of her hyponatremia. Serum sodium showed little improvement and has been transferred to the ICU and started on Hypertonic 3% solution.   SIGNIFICANT EVENTS: 8/11> CXR- no acute cardiopulmonary abnormality  8/12>CT Chest-  6mm pulm nodule in RUL, multiple acute and chronic left-sided rib fractures, bilateral thyroid nodules, outpouching in the posterior proximal abdominal aorta  8/13> D/C hypertonic 3% solution    CULTURES:  8/11>SARS COVID19: negative  8/12> MRSA: negative    PAST MEDICAL HISTORY :   has a past medical history of Carotid artery stenosis, Depression, GERD (gastroesophageal reflux disease), History of CVA (cerebrovascular accident), Hyperlipidemia, Hypertension, and Insomnia.  has a past surgical history that includes Back surgery; Carotid angioplasty (Left, 2015); Shoulder surgery (Right, 1997); and Abdominal hysterectomy (1977).   Allergies  Allergen Reactions  . Morphine And Related  Anxiety   REVIEW OF SYSTEMS:   Gen:  Denies  fever, sweats, chills weight loss  HEENT: Denies blurred vision, double vision, ear pain, eye pain, hearing loss, nose bleeds, sore throat Cardiac:  No dizziness, chest pain or heaviness, chest tightness,edema, No JVD Resp:   No cough, -sputum production, -shortness of breath,-wheezing, -hemoptysis,  Gi: Denies swallowing difficulty, stomach pain, nausea or vomiting, diarrhea, constipation, bowel incontinence Gu:  Denies bladder incontinence, burning urine Ext:   Denies Joint pain, stiffness or swelling Skin: Denies  skin rash, easy bruising or bleeding or hives Endoc:  Denies polyuria, polydipsia , polyphagia or weight change Psych:   Denies depression, insomnia or hallucinations  Other:  All other systems negative   VITAL SIGNS: Temp:  [98 F (36.7 C)-98.7 F (37.1 C)] 98.7 F (37.1 C) (08/13 0700) Pulse Rate:  [77-98] 83 (08/13 1000) Resp:  [12-22] 13 (08/13 1000) BP: (77-161)/(41-107) 154/95 (08/13 1140) SpO2:  [93 %-98 %] 96 % (08/13 1000) Weight:  [57.4 kg] 57.4 kg (08/12 1450)   I/O last 3 completed shifts: In: 1970.3 [P.O.:480; I.V.:1440.3; IV Piggyback:50] Out: 1350 [Urine:1350] Total I/O In: -  Out: 100 [Urine:100]   SpO2: 96 % O2 Flow Rate (L/min): 100 L/min   Physical Examination:  Constitutional: She is oriented to person, place, and time. Vital signs are normal. She appears well-developed and well-nourished. No distress.  HENT:  Head: Normocephalic and atraumatic.  Mouth/Throat: Oropharynx is clear and moist.  Eyes: Pupils are equal, round, and reactive to light. EOM are normal. No scleral icterus.  Neck: Neck supple. No JVD present.  Cardiovascular: Normal rate, regular rhythm and normal heart sounds. Exam reveals no  gallop and no friction rub.  No murmur heard. Pulmonary/Chest: Effort normal and breath sounds normal. No respiratory distress. She has no wheezes. She has no rales.  Abdominal: Soft. Bowel  sounds are normal. She exhibits no distension. There is no abdominal tenderness.  Musculoskeletal:        General: No edema.     Comments: 5+ strength bilaterally in lower extremities    Neurological: She is alert and oriented to person, place, and time.  Skin: Skin is warm and dry.   I personally reviewed lab work that was obtained in last 24 hrs.  BMP Latest Ref Rng & Units 12/17/2018 12/17/2018 12/16/2018  Glucose 70 - 99 mg/dL - 88 -  BUN 8 - 23 mg/dL - 8 -  Creatinine 0.44 - 1.00 mg/dL - 0.75 -  Sodium 135 - 145 mmol/L 128(L) 126(L) 126(L)  Potassium 3.5 - 5.1 mmol/L - 3.6 -  Chloride 98 - 111 mmol/L - 95(L) -  CO2 22 - 32 mmol/L - 22 -  Calcium 8.9 - 10.3 mg/dL - 8.6(L) -   Magnesium: 2.1  Phosphorus: 3.4  MEDICATIONS: I have reviewed all medications and confirmed regimen as documented   ASSESSMENT AND PLAN SYNOPSIS  78 y.o female with history of diuretic use and improving hyponatremia no longer requiring hypertonic 3% solution.    Hyponatremia- Improved -Nephrology consulted -HCTZ/ Neurontin discontinued  -Hypertonic 3% solution D/C -Continue to monitor electrolytes and replaced PRN  Hypertension -amlodipine   Insomnia -Triazolam at bedtime PRN  CARDIAC ICU monitoring  GI GI PROPHYLAXIS as indicated  -Pantoprazole   NUTRITIONAL STATUS DIET--> Thin fluids Constipation protocol as indicated  -Colace  ENDO - will use ICU hypoglycemic\Hyperglycemia protocol if needed   ELECTROLYTES -follow labs as needed -replace as needed -pharmacy consultation and following   DVT/GI PRX ordered- heparin injection TRANSFUSIONS AS NEEDED MONITOR FSBS ASSESS the need for LABS    Patient's hyponatremia is improving and vital signs remain stable. Recommend transferring patient to floor care   Anibal Henderson PA-S

## 2018-12-18 LAB — CBC WITH DIFFERENTIAL/PLATELET
Abs Immature Granulocytes: 0.08 10*3/uL — ABNORMAL HIGH (ref 0.00–0.07)
Basophils Absolute: 0 10*3/uL (ref 0.0–0.1)
Basophils Relative: 1 %
Eosinophils Absolute: 0.1 10*3/uL (ref 0.0–0.5)
Eosinophils Relative: 1 %
HCT: 38.6 % (ref 36.0–46.0)
Hemoglobin: 13.5 g/dL (ref 12.0–15.0)
Immature Granulocytes: 2 %
Lymphocytes Relative: 16 %
Lymphs Abs: 0.8 10*3/uL (ref 0.7–4.0)
MCH: 29.7 pg (ref 26.0–34.0)
MCHC: 35 g/dL (ref 30.0–36.0)
MCV: 84.8 fL (ref 80.0–100.0)
Monocytes Absolute: 0.6 10*3/uL (ref 0.1–1.0)
Monocytes Relative: 12 %
Neutro Abs: 3.8 10*3/uL (ref 1.7–7.7)
Neutrophils Relative %: 68 %
Platelets: 289 10*3/uL (ref 150–400)
RBC: 4.55 MIL/uL (ref 3.87–5.11)
RDW: 13.4 % (ref 11.5–15.5)
WBC: 5.4 10*3/uL (ref 4.0–10.5)
nRBC: 0 % (ref 0.0–0.2)

## 2018-12-18 LAB — BASIC METABOLIC PANEL
Anion gap: 9 (ref 5–15)
Anion gap: 11 (ref 5–15)
BUN: 6 mg/dL — ABNORMAL LOW (ref 8–23)
BUN: 6 mg/dL — ABNORMAL LOW (ref 8–23)
CO2: 25 mmol/L (ref 22–32)
CO2: 22 mmol/L (ref 22–32)
Calcium: 8.9 mg/dL (ref 8.9–10.3)
Calcium: 9.2 mg/dL (ref 8.9–10.3)
Chloride: 92 mmol/L — ABNORMAL LOW (ref 98–111)
Chloride: 93 mmol/L — ABNORMAL LOW (ref 98–111)
Creatinine, Ser: 0.66 mg/dL (ref 0.44–1.00)
Creatinine, Ser: 0.79 mg/dL (ref 0.44–1.00)
GFR calc Af Amer: >60 mL/min (ref >60)
GFR calc Af Amer: >60 mL/min (ref >60)
GFR calc non Af Amer: >60 mL/min (ref >60)
GFR calc non Af Amer: >60 mL/min (ref >60)
Glucose, Bld: 103 mg/dL — ABNORMAL HIGH (ref 70–99)
Glucose, Bld: 113 mg/dL — ABNORMAL HIGH (ref 70–99)
Potassium: 3.7 mmol/L (ref 3.5–5.1)
Potassium: 4 mmol/L (ref 3.5–5.1)
Sodium: 126 mmol/L — ABNORMAL LOW (ref 135–145)
Sodium: 126 mmol/L — ABNORMAL LOW (ref 135–145)

## 2018-12-18 LAB — SODIUM: Sodium: 128 mmol/L — ABNORMAL LOW (ref 135–145)

## 2018-12-18 MED ORDER — HYDRALAZINE HCL 20 MG/ML IJ SOLN
10.0000 mg | Freq: Once | INTRAMUSCULAR | Status: AC
  Administered 2018-12-18: 22:00:00 10 mg via INTRAVENOUS
  Filled 2018-12-18: qty 1

## 2018-12-18 MED ORDER — TOLVAPTAN 15 MG PO TABS
15.0000 mg | ORAL_TABLET | ORAL | Status: DC
  Administered 2018-12-18 – 2018-12-19 (×2): 15 mg via ORAL
  Filled 2018-12-18 (×2): qty 1

## 2018-12-18 MED ORDER — HYDROCODONE-ACETAMINOPHEN 5-325 MG PO TABS
1.0000 | ORAL_TABLET | Freq: Once | ORAL | Status: AC
  Administered 2018-12-19: 1 via ORAL
  Filled 2018-12-18: qty 1

## 2018-12-18 NOTE — Progress Notes (Signed)
[facility]  ROUNDING NOTE   Subjective:  Patient seen at bedside. Husband at bedside as well. Sodium 126 at the moment. Patient started on tolvaptan today.  Objective:  Vital signs in last 24 hours:  Temp:  [97.6 F (36.4 C)-98.6 F (37 C)] 98.2 F (36.8 C) (08/14 0751) Pulse Rate:  [84-92] 89 (08/14 0751) Resp:  [14-22] 16 (08/14 0532) BP: (130-175)/(58-95) 169/78 (08/14 0751) SpO2:  [95 %-100 %] 98 % (08/14 0751) Weight:  [57.6 kg] 57.6 kg (08/14 0532)  Weight change: 0.2 kg Filed Weights   12/15/18 1800 12/16/18 1450 12/18/18 0532  Weight: 61.2 kg 57.4 kg 57.6 kg    Intake/Output: I/O last 3 completed shifts: In: 625.7 [P.O.:480; I.V.:145.7] Out: 800 [Urine:800]   Intake/Output this shift:  Total I/O In: 237 [P.O.:237] Out: 350 [Urine:350]  Physical Exam: General: No acute distress  Head: Normocephalic, atraumatic. Moist oral mucosal membranes  Eyes: Anicteric  Neck: Supple, trachea midline  Lungs:  Clear to auscultation, normal effort  Heart: S1S2 no rubs  Abdomen:  Soft, nontender, bowel sounds present  Extremities: No peripheral edema.  Neurologic: Awake, alert, following commands  Skin: No lesions       Basic Metabolic Panel: Recent Labs  Lab 12/15/18 1808 12/16/18 0437  12/16/18 1956 12/16/18 2341 12/17/18 0334 12/17/18 0748 12/18/18 0726  NA 116* 117*   < > 123* 126* 126* 128* 126*  K 4.3 3.6  --   --   --  3.6  --  3.7  CL 79* 87*  --   --   --  95*  --  92*  CO2 23 21*  --   --   --  22  --  25  GLUCOSE 139* 112*  --   --   --  88  --  103*  BUN 9 8  --   --   --  8  --  6*  CREATININE 0.79 0.79  --   --   --  0.75  --  0.66  CALCIUM 9.3 8.3*  --   --   --  8.6*  --  8.9  MG 1.6*  --   --   --   --  2.1  --   --   PHOS 2.5  --   --   --   --  3.4  --   --    < > = values in this interval not displayed.    Liver Function Tests: Recent Labs  Lab 12/15/18 1808  AST 28  ALT 14  ALKPHOS 106  BILITOT 0.4  PROT 7.9   ALBUMIN 4.5   No results for input(s): LIPASE, AMYLASE in the last 168 hours. No results for input(s): AMMONIA in the last 168 hours.  CBC: Recent Labs  Lab 12/15/18 1808 12/16/18 0437 12/18/18 0726  WBC 6.7 6.6 5.4  NEUTROABS  --   --  3.8  HGB 14.8 13.2 13.5  HCT 40.6 36.2 38.6  MCV 81.5 81.3 84.8  PLT 334 305 289    Cardiac Enzymes: No results for input(s): CKTOTAL, CKMB, CKMBINDEX, TROPONINI in the last 168 hours.  BNP: Invalid input(s): POCBNP  CBG: Recent Labs  Lab 12/16/18 1453  GLUCAP 113*    Microbiology: Results for orders placed or performed during the hospital encounter of 12/15/18  SARS CORONAVIRUS 2 Nasal Swab Aptima Multi Swab     Status: None   Collection Time: 12/15/18  7:03 PM   Specimen: Aptima  Multi Swab; Nasal Swab  Result Value Ref Range Status   SARS Coronavirus 2 NEGATIVE NEGATIVE Final    Comment: (NOTE) SARS-CoV-2 target nucleic acids are NOT DETECTED. The SARS-CoV-2 RNA is generally detectable in upper and lower respiratory specimens during the acute phase of infection. Negative results do not preclude SARS-CoV-2 infection, do not rule out co-infections with other pathogens, and should not be used as the sole basis for treatment or other patient management decisions. Negative results must be combined with clinical observations, patient history, and epidemiological information. The expected result is Negative. Fact Sheet for Patients: HairSlick.nohttps://www.fda.gov/media/138098/download Fact Sheet for Healthcare Providers: quierodirigir.comhttps://www.fda.gov/media/138095/download This test is not yet approved or cleared by the [place] FDA and  has been authorized for detection and/or diagnosis of SARS-CoV-2 by FDA under an Emergency Use Authorization (EUA). This EUA will remain  in effect (meaning this test can be used) for the duration of the COVID-19 declaration under Section 56 4(b)(1) of the Act, 21 U.S.C. section 360bbb-3(b)(1), unless the  authorization is terminated or revoked sooner. Performed at [facility], [address] [place], [place] [address]   MRSA PCR Screening     Status: None   Collection Time: 12/16/18  2:59 PM   Specimen: Nasopharyngeal  Result Value Ref Range Status   MRSA by PCR NEGATIVE NEGATIVE Final    Comment:        The GeneXpert MRSA Assay (FDA approved for NASAL specimens only), is one component of a comprehensive MRSA colonization surveillance program. It is not intended to diagnose MRSA infection nor to guide or monitor treatment for MRSA infections. Performed at [facility], [address] [place], [place] [address]     Coagulation Studies: No results for input(s): LABPROT, INR in the last 72 hours.  Urinalysis: Recent Labs    12/15/18 1834  COLORURINE YELLOW*  LABSPEC 1.012  PHURINE 7.0  GLUCOSEU NEGATIVE  HGBUR NEGATIVE  BILIRUBINUR NEGATIVE  KETONESUR NEGATIVE  PROTEINUR 100*  NITRITE NEGATIVE  LEUKOCYTESUR TRACE*      Imaging: Ct Chest W Contrast  Result Date: 12/16/2018 CLINICAL DATA:  Hyponatremia.  Concern for lung mass. EXAM: CT CHEST WITH CONTRAST TECHNIQUE: Multidetector CT imaging of the chest was performed during intravenous contrast administration. CONTRAST:  75mL OMNIPAQUE IOHEXOL 300 MG/ML  SOLN COMPARISON:  None. FINDINGS: Cardiovascular: There is no large centrally located pulmonary embolus. Detection of smaller segmental and subsegmental pulmonary emboli is limited by contrast bolus timing. There is no thoracic aortic dissection. Atherosclerotic changes are noted of the thoracic aorta. The thoracic aorta is normal in size. Coronary artery calcifications are noted. The heart size is normal. Mediastinum/Nodes: --No mediastinal or hilar lymphadenopathy. --No axillary lymphadenopathy. --No supraclavicular lymphadenopathy. --multiple thyroid nodules are noted bilaterally. --The esophagus is unremarkable Lungs/Pleura: There is a 6 mm pulmonary  nodule in the right upper lobe (axial series 3, image 51). There is a perifissural plate like opacity involving the right middle lobe measuring approximately 9 mm (axial series 3, image 69). Given its appearance on the coronal view, this is almost certainly a benign process. There is no pneumothorax. No large pleural effusion. The trachea is essentially unremarkable however there does appear to be some debris within the left mainstem bronchus. Upper Abdomen: There is no acute abnormality. There is some mild thickening of both adrenal glands which is nonspecific. There is a focal outpouching of the proximal abdominal aorta posteriorly at the level of the celiac axis. This measures approximately 1.4 x 1 cm.  The aorta at this level measures approximately 2 point 5 cm in diameter. Musculoskeletal: The patient is status post ORIF of the right humerus. There is a subacute appearing partially visualized impacted fracture of the proximal left humerus. There are multiple old left-sided rib fractures. There are superimposed acute fractures involving the ninth and tenth ribs posterolaterally on the left. Review of the MIP images confirms the above findings. IMPRESSION: 1. There is a 6 mm pulmonary nodule in the right upper lobe as detailed above. Non-contrast chest CT at 6-12 months is recommended. If the nodule is stable at time of repeat CT, then future CT at 18-24 months (from today's scan) is considered optional for low-risk patients, but is recommended for high-risk patients. This recommendation follows the consensus statement: Guidelines for Management of Incidental Pulmonary Nodules Detected on CT Images: From the Fleischner Society 2017; Radiology 2017; 284:228-243. 2. Subacute appearing partially visualized impacted fracture of the proximal left humerus. Dedicated left shoulder radiographs may be useful for further evaluation of this finding. 3. Multiple acute and chronic left-sided rib fractures as detailed above. 4.  There are bilateral thyroid nodules. These can be further evaluated with ultrasound as clinically indicated. 5. Focal outpouching of the posterior proximal abdominal aorta as detailed above. This is of doubtful clinical significance, however a follow-up CT a in 6 months should be considered to confirm stability of this finding. Aortic Atherosclerosis (ICD10-I70.0) and Emphysema (ICD10-J43.9). Electronically Signed   By: Constance Holster M.D.   On: 12/16/2018 13:53   [place] Ekg Site Rite  Result Date: 12/16/2018 If Site Rite image not attached, placement could not be confirmed due to current cardiac rhythm.    Medications:    . amLODipine  2.5 mg Oral Daily  . aspirin EC  81 mg Oral Daily  . calcium-vitamin D  1 tablet Oral BID WC  . clopidogrel  75 mg Oral Daily  . heparin  5,000 Units Subcutaneous Q8H  . lisinopril  40 mg Oral Daily  . pantoprazole  40 mg Oral Daily  . sodium chloride flush  10-40 mL Intracatheter Q12H  . sodium chloride  1 g Oral BID WC  . tolvaptan  15 mg Oral Q24H  . cyanocobalamin  2,000 mcg Oral Daily   acetaminophen, docusate sodium, sodium chloride flush, triazolam  Assessment/ Plan:  78 y.o. female with a PMHx of carotid artery stenosis, depression, GERD, history of CVA, hyperlipidemia, hypertension, insomnia, tobacco abuse, who was admitted to [facility] on 12/15/2018 for evaluation of altered mental status and abnormal labs.   1. Hyponatremia. 2.  Small pulmonary nodule. 3.  Hypertension.  Plan:  Patient started on tolvaptan 15 mg every 24 hours for now.  Monitor serum sodium closely.  Target sodium by tomorrow will be 133.  Continue sodium chloride 1 g twice daily.  Patient also counseled on fluid restriction.  She verbalized understanding of this.   LOS: 3 Eastin Swing 8/14/202011:08 AM

## 2018-12-18 NOTE — Progress Notes (Signed)
[facility] NAME: Ruth Ramirez    MR#:  [id number]  DATE OF BIRTH:  [birth year]  SUBJECTIVE:   Sodium level down to 126 today.  Seen by nephrology and plan on starting tolvaptan and continuing salt tablets.  Patient is alert and oriented.  No other acute events overnight.  REVIEW OF SYSTEMS:    Review of Systems  Constitutional: Negative for chills and fever.  HENT: Negative for congestion and tinnitus.   Eyes: Negative for blurred vision and double vision.  Respiratory: Negative for cough, shortness of breath and wheezing.   Cardiovascular: Negative for chest pain, orthopnea and PND.  Gastrointestinal: Negative for abdominal pain, diarrhea, nausea and vomiting.  Genitourinary: Negative for dysuria and hematuria.  Neurological: Positive for weakness (generalized). Negative for dizziness, sensory change and focal weakness.  All other systems reviewed and are negative.   Nutrition: Heart Healthy Tolerating Diet: Yes Tolerating PT: Eval noted.   DRUG ALLERGIES:   Allergies  Allergen Reactions  . Morphine And Related Anxiety    VITALS:  Blood pressure (!) 169/78, pulse 89, temperature 98.2 F (36.8 C), temperature source Oral, resp. rate 16, height 5\' 2"  (1.575 m), weight 57.6 kg, SpO2 98 %.  PHYSICAL EXAMINATION:   Physical Exam  GENERAL:  78 y.o.-year-old patient lying in bed in no acute distress.  EYES: Pupils equal, round, reactive to light and accommodation. No scleral icterus. Extraocular muscles intact.  HEENT: Head atraumatic, normocephalic. Oropharynx and nasopharynx clear.  NECK:  Supple, no jugular venous distention. No thyroid enlargement, no tenderness.  LUNGS: Normal breath sounds bilaterally, no wheezing, rales, rhonchi. No use of accessory muscles of respiration.  CARDIOVASCULAR: S1, S2 normal. No murmurs, rubs, or gallops.  ABDOMEN: Soft, nontender, nondistended. Bowel sounds present. No organomegaly  or mass.  EXTREMITIES: No cyanosis, clubbing or edema b/l.    NEUROLOGIC: Cranial nerves II through XII are intact. No focal Motor or sensory deficits b/l. Globally weak.    PSYCHIATRIC: The patient is alert and oriented x 3.  SKIN: No obvious rash, lesion, or ulcer.    LABORATORY PANEL:   CBC Recent Labs  Lab 12/18/18 0726  WBC 5.4  HGB 13.5  HCT 38.6  PLT 289   ------------------------------------------------------------------------------------------------------------------  Chemistries  Recent Labs  Lab 12/15/18 1808  12/17/18 0334  12/18/18 1136  NA 116*   < > 126*   < > 126*  K 4.3   < > 3.6   < > 4.0  CL 79*   < > 95*   < > 93*  CO2 23   < > 22   < > 22  GLUCOSE 139*   < > 88   < > 113*  BUN 9   < > 8   < > 6*  CREATININE 0.79   < > 0.75   < > 0.79  CALCIUM 9.3   < > 8.6*   < > 9.2  MG 1.6*  --  2.1  --   --   AST 28  --   --   --   --   ALT 14  --   --   --   --   ALKPHOS 106  --   --   --   --   BILITOT 0.4  --   --   --   --    < > = values in this interval not displayed.   ------------------------------------------------------------------------------------------------------------------  Cardiac Enzymes No results for input(s): TROPONINI in the last 168 hours. ------------------------------------------------------------------------------------------------------------------  RADIOLOGY:  No results found.   ASSESSMENT AND PLAN:   78 year old female with past medical history of central hypertension, hyperlipidemia, history of previous CVA, GERD, depression who presented to the hospital due to weakness and altered mental status and noted to be hyponatremic.  1.  Acute hyponatremia- patient presented to the hospital with sodium of 117. -Likely SIADH, received 3% saline in the ICU and sodium improved to 128 but down to 126 today. -Discussed with nephrology, continue salt tablets, fluid restriction.  We will give the patient 1 dose of tolvaptan today.  -Follow sodium to a goal of 133 tomorrow.  Continue to hold HCTZ. - CT chest was negative for lung mass but showed a pulmonary nodule.  2.  Essential hypertension-continue lisinopril, Norvasc. - BP stable.   3.  History of previous CVA- continue aspirin, Plavix.  4.  Osteoporosis- continue calcium vitamin D supplements.  5.  GERD- continue Protonix.  PT Evaluation noted and will arrange home health prior to discharge.  Likely discharge tomorrow if sodium has improved.   All the records are reviewed and case discussed with Care Management/Social Worker. Management plans discussed with the patient, family and they are in agreement.  CODE STATUS: Full code  DVT Prophylaxis: Heparin subcu  TOTAL TIME TAKING CARE OF THIS PATIENT: 30 minutes.   POSSIBLE D/C IN 1-2 DAYS, DEPENDING ON CLINICAL CONDITION.   Houston SirenVivek J Sainani M.D on 12/18/2018 at 1:13 PM  Between 7am to 6pm - Pager - [phone]  After 6pm go to [email or web address] - Social research officer, [email or web address] [email or web address]  [facility]  Office  [phone]  CC: Primary care physician; Marina GoodellFeldpausch, Dale E, MD

## 2018-12-18 NOTE — Care Management Important Message (Signed)
Important Message  Patient Details  Name: Ruth Ramirez MRN: [id number] Date of Birth: Mar 30, 1941   Medicare Important Message Given:  Yes     Dannette Barbara 12/18/2018, 11:06 AM

## 2018-12-19 LAB — BASIC METABOLIC PANEL
Anion gap: 8 (ref 5–15)
BUN: 9 mg/dL (ref 8–23)
CO2: 25 mmol/L (ref 22–32)
Calcium: 9.3 mg/dL (ref 8.9–10.3)
Chloride: 97 mmol/L — ABNORMAL LOW (ref 98–111)
Creatinine, Ser: 0.97 mg/dL (ref 0.44–1.00)
GFR calc Af Amer: >60 mL/min (ref >60)
GFR calc non Af Amer: 55 mL/min — ABNORMAL LOW (ref >60)
Glucose, Bld: 104 mg/dL — ABNORMAL HIGH (ref 70–99)
Potassium: 3.9 mmol/L (ref 3.5–5.1)
Sodium: 130 mmol/L — ABNORMAL LOW (ref 135–145)

## 2018-12-19 MED ORDER — GABAPENTIN 300 MG PO CAPS: 300.0000 mg | ORAL_CAPSULE | Freq: Two times a day (BID) | ORAL | Status: DC

## 2018-12-19 MED ORDER — SODIUM CHLORIDE 1 G PO TABS: 1.0000 g | ORAL_TABLET | Freq: Two times a day (BID) | ORAL | 0 refills | Status: DC

## 2018-12-19 NOTE — Discharge Summary (Signed)
[facility] [place] [facility]   PATIENT NAME: Ruth Ramirez    MR#:  [id number]  DATE OF BIRTH:  11/27/1940  DATE OF ADMISSION:  12/15/2018 ADMITTING PHYSICIAN: Altamese DillingVaibhavkumar Vachhani, MD  DATE OF DISCHARGE: 12/19/2018  1:28 PM  PRIMARY CARE PHYSICIAN: Marina GoodellFeldpausch, Dale E, MD    ADMISSION DIAGNOSIS:  Confusion [R41.0] Hyponatremia [E87.1] Weakness [R53.1] Acute nonintractable headache, unspecified headache type [R51]  DISCHARGE DIAGNOSIS:  Principal Problem:   Hyponatremia   SECONDARY DIAGNOSIS:   Past Medical History:  Diagnosis Date  . Carotid artery stenosis   . Depression   . GERD (gastroesophageal reflux disease)   . History of CVA (cerebrovascular accident)   . Hyperlipidemia   . Hypertension   . Insomnia     HOSPITAL COURSE:   1.  Acute hyponatremia.  The patient was admitted to the hospital with acute metabolic encephalopathy secondary to hyponatremia.  Her sodium was 117 on presentation.  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  The hydrochlorothiazide was discontinued.  I told the patient and her husband that the hydrochlorothiazide must never be restarted because this is not a good medication for this patient.  Fluid restriction of 1.2 L.  Salt tablets twice a day.  Follow-up with nephrology as outpatient in 1 to 2 weeks for recheck and BMP. 2.  Essential hypertension can continue Norvasc and lisinopril.  Again hydrochlorothiazide was discontinued and should not be restarted. 3.  Stroke history on aspirin and Plavix 4.  GERD on Protonix 5.  Osteoporosis 6.  Pulmonary nodule in the right upper lobe 6 mm.  This will need a follow-up CT scan of the chest in 6 months. 7.  Thyroid nodule seen on CT scan of the chest.  TSH normal range.  Can consider thyroid ultrasound as outpatient. 8.  Also on CT scan of the chest they did see subacute impacted fracture of the left humerus and multiple acute and chronic left-sided rib  fractures. 9.  Weakness.  Patient did well with physical therapy 10.  Neuropathy can go back on gabapentin.  Patient states she only takes it twice a day.  DISCHARGE CONDITIONS:   Satisfactory  CONSULTS OBTAINED:  Nephrology  DRUG ALLERGIES:   Allergies  Allergen Reactions  . Morphine And Related Anxiety    DISCHARGE MEDICATIONS:   Allergies as of 12/19/2018      Reactions   Morphine And Related Anxiety      Medication List    STOP taking these medications   hydrochlorothiazide 12.5 MG tablet Commonly known as: HYDRODIURIL     TAKE these medications   amLODipine 2.5 MG tablet Commonly known as: NORVASC Take 2.5 mg by mouth daily.   aspirin 81 MG tablet Take 81 mg by mouth daily.   calcium carbonate 600 MG Tabs tablet Commonly known as: OS-CAL Take 600 mg by mouth 2 (two) times daily with a meal.   clopidogrel 75 MG tablet Commonly known as: PLAVIX Take 75 mg by mouth daily.   [employer] VITAMIN B12 2000 MCG tablet Generic drug: cyanocobalamin Take by mouth.   gabapentin 300 MG capsule Commonly known as: NEURONTIN Take 1 capsule (300 mg total) by mouth 2 (two) times daily. What changed: when to take this   lansoprazole 30 MG capsule Commonly known as: PREVACID Take 30 mg by mouth daily at 12 noon.   lisinopril 40 MG tablet Commonly known as: ZESTRIL Take 40 mg by mouth daily.   sodium chloride 1 g tablet  Take 1 tablet (1 g total) by mouth 2 (two) times daily with a meal.   triazolam 0.125 MG tablet Commonly known as: HALCION Take 0.125 mg by mouth at bedtime as needed for sleep. May take 1/2 extra as needed        DISCHARGE INSTRUCTIONS:   Follow-up PMD 5 days Follow-up nephrology 1 to 2 weeks  If you experience worsening of your admission symptoms, develop shortness of breath, life threatening emergency, suicidal or homicidal thoughts you must seek medical attention immediately by calling 911 or calling your MD immediately  if symptoms less  severe.  You Must read complete instructions/literature along with all the possible adverse reactions/side effects for all the Medicines you take and that have been prescribed to you. Take any new Medicines after you have completely understood and accept all the possible adverse reactions/side effects.   Please note  You were cared for by a hospitalist during your hospital stay. If you have any questions about your discharge medications or the care you received while you were in the hospital after you are discharged, you can call the unit and asked to speak with the hospitalist on call if the hospitalist that took care of you is not available. Once you are discharged, your primary care physician will handle any further medical issues. Please note that NO REFILLS for any discharge medications will be authorized once you are discharged, as it is imperative that you return to your primary care physician (or establish a relationship with a primary care physician if you do not have one) for your aftercare needs so that they can reassess your need for medications and monitor your lab values.    Today   CHIEF COMPLAINT:   Chief Complaint  Patient presents with  . Headache  . Abnormal Lab  . Altered Mental Status    HISTORY OF PRESENT ILLNESS:  Ruth Ramirez  is a 78 y.o. female came in with workload mental status and had a very low sodium of 117   VITAL SIGNS:  Blood pressure 127/62, pulse 100, temperature 97.9 F (36.6 C), temperature source Oral, resp. rate 18, height 5\' 2"  (1.575 m), weight 57.6 kg, SpO2 98 %.   PHYSICAL EXAMINATION:  GENERAL:  78 y.o.-year-old patient lying in the bed with no acute distress.  EYES: Pupils equal, round, reactive to light and accommodation. No scleral icterus. Extraocular muscles intact.  HEENT: Head atraumatic, normocephalic. Oropharynx and nasopharynx clear.  NECK:  Supple, no jugular venous distention. No thyroid enlargement, no tenderness.  LUNGS:  Normal breath sounds bilaterally, no wheezing, rales,rhonchi or crepitation. No use of accessory muscles of respiration.  CARDIOVASCULAR: S1, S2 normal. No murmurs, rubs, or gallops.  ABDOMEN: Soft, non-tender, non-distended. Bowel sounds present. No organomegaly or mass.  EXTREMITIES: No pedal edema, cyanosis, or clubbing.  NEUROLOGIC: Cranial nerves II through XII are intact. Muscle strength 5/5 in all extremities. Sensation intact. Gait not checked.  PSYCHIATRIC: The patient is alert and oriented x 3.  SKIN: No obvious rash, lesion, or ulcer.   DATA REVIEW:   CBC Recent Labs  Lab 12/18/18 0726  WBC 5.4  HGB 13.5  HCT 38.6  PLT 289    Chemistries  Recent Labs  Lab 12/15/18 1808  12/17/18 0334  12/19/18 0418  NA 116*   < > 126*   < > 130*  K 4.3   < > 3.6   < > 3.9  CL 79*   < > 95*   < >  97*  CO2 23   < > 22   < > 25  GLUCOSE 139*   < > 88   < > 104*  BUN 9   < > 8   < > 9  CREATININE 0.79   < > 0.75   < > 0.97  CALCIUM 9.3   < > 8.6*   < > 9.3  MG 1.6*  --  2.1  --   --   AST 28  --   --   --   --   ALT 14  --   --   --   --   ALKPHOS 106  --   --   --   --   BILITOT 0.4  --   --   --   --    < > = values in this interval not displayed.    Microbiology Results  Results for orders placed or performed during the hospital encounter of 12/15/18  SARS CORONAVIRUS 2 Nasal Swab Aptima Multi Swab     Status: None   Collection Time: 12/15/18  7:03 PM   Specimen: Aptima Multi Swab; Nasal Swab  Result Value Ref Range Status   SARS Coronavirus 2 NEGATIVE NEGATIVE Final    Comment: (NOTE) SARS-CoV-2 target nucleic acids are NOT DETECTED. The SARS-CoV-2 RNA is generally detectable in upper and lower respiratory specimens during the acute phase of infection. Negative results do not preclude SARS-CoV-2 infection, do not rule out co-infections with other pathogens, and should not be used as the sole basis for treatment or other patient management decisions. Negative results  must be combined with clinical observations, patient history, and epidemiological information. The expected result is Negative. Fact Sheet for Patients: HairSlick.nohttps://www.fda.gov/media/138098/download Fact Sheet for Healthcare Providers: quierodirigir.comhttps://www.fda.gov/media/138095/download This test is not yet approved or cleared by the [place] FDA and  has been authorized for detection and/or diagnosis of SARS-CoV-2 by FDA under an Emergency Use Authorization (EUA). This EUA will remain  in effect (meaning this test can be used) for the duration of the COVID-19 declaration under Section 56 4(b)(1) of the Act, 21 U.S.C. section 360bbb-3(b)(1), unless the authorization is terminated or revoked sooner. Performed at [facility], [address] [place], [place] [address]   MRSA PCR Screening     Status: None   Collection Time: 12/16/18  2:59 PM   Specimen: Nasopharyngeal  Result Value Ref Range Status   MRSA by PCR NEGATIVE NEGATIVE Final    Comment:        The GeneXpert MRSA Assay (FDA approved for NASAL specimens only), is one component of a comprehensive MRSA colonization surveillance program. It is not intended to diagnose MRSA infection nor to guide or monitor treatment for MRSA infections. Performed at [facility], [address] [place], [place] [address]       Management plans discussed with the patient, family and they are in agreement.  CODE STATUS:     Code Status Orders  (From admission, onward)         Start     Ordered   12/15/18 2318  Full code  Continuous     12/15/18 2317        Code Status History    This patient has a current code status but no historical code status.   Advance Care Planning Activity      TOTAL TIME TAKING CARE OF THIS PATIENT: 35 minutes.    Alford Highlandichard [facility] Teegarden M.D on 12/19/2018 at  1:29 PM  Between 7am to 6pm - Pager - [phone]  After 6pm go to [email or web address] - password [employer]  [facility] Office   [phone]  CC: Primary care physician; Sofie Hartigan, MD

## 2018-12-19 NOTE — Discharge Instructions (Signed)
Fluid restrction to 1.2 Liters per day Stop Hydrochlorothiazide Continue salt tablets that I prescribed

## 2018-12-19 NOTE — Progress Notes (Signed)
[facility]  ROUNDING NOTE   Subjective:  Patient reports that she is feeling better. Serum sodium up to 130. Patient counseled on 1.2 L fluid restriction. It appears tolvaptan has been effective.  Objective:  Vital signs in last 24 hours:  Temp:  [97.5 F (36.4 C)-98.3 F (36.8 C)] 97.9 F (36.6 C) (08/15 0857) Pulse Rate:  [86-100] 100 (08/15 0857) Resp:  [17-18] 18 (08/15 0857) BP: (127-176)/(59-74) 127/62 (08/15 0857) SpO2:  [96 %-99 %] 98 % (08/15 0857)  Weight change:  Filed Weights   12/15/18 1800 12/16/18 1450 12/18/18 0532  Weight: 61.2 kg 57.4 kg 57.6 kg    Intake/Output: I/O last 3 completed shifts: In: 67 [P.O.:477] Out: 350 [Urine:350]   Intake/Output this shift:  Total I/O In: 120 [P.O.:120] Out: -   Physical Exam: General: No acute distress  Head: Normocephalic, atraumatic. Moist oral mucosal membranes  Eyes: Anicteric  Neck: Supple, trachea midline  Lungs:  Clear to auscultation, normal effort  Heart: S1S2 no rubs  Abdomen:  Soft, nontender, bowel sounds present  Extremities: No peripheral edema.  Neurologic: Awake, alert, following commands  Skin: No lesions       Basic Metabolic Panel: Recent Labs  Lab 12/15/18 1808 12/16/18 0437  12/17/18 0334 12/17/18 0748 12/18/18 0726 12/18/18 1136 12/18/18 1935 12/19/18 0418  NA 116* 117*   < > 126* 128* 126* 126* 128* 130*  K 4.3 3.6  --  3.6  --  3.7 4.0  --  3.9  CL 79* 87*  --  95*  --  92* 93*  --  97*  CO2 23 21*  --  22  --  25 22  --  25  GLUCOSE 139* 112*  --  88  --  103* 113*  --  104*  BUN 9 8  --  8  --  6* 6*  --  9  CREATININE 0.79 0.79  --  0.75  --  0.66 0.79  --  0.97  CALCIUM 9.3 8.3*  --  8.6*  --  8.9 9.2  --  9.3  MG 1.6*  --   --  2.1  --   --   --   --   --   PHOS 2.5  --   --  3.4  --   --   --   --   --    < > = values in this interval not displayed.    Liver Function Tests: Recent Labs  Lab 12/15/18 1808  AST 28  ALT 14  ALKPHOS 106   BILITOT 0.4  PROT 7.9  ALBUMIN 4.5   No results for input(s): LIPASE, AMYLASE in the last 168 hours. No results for input(s): AMMONIA in the last 168 hours.  CBC: Recent Labs  Lab 12/15/18 1808 12/16/18 0437 12/18/18 0726  WBC 6.7 6.6 5.4  NEUTROABS  --   --  3.8  HGB 14.8 13.2 13.5  HCT 40.6 36.2 38.6  MCV 81.5 81.3 84.8  PLT 334 305 289    Cardiac Enzymes: No results for input(s): CKTOTAL, CKMB, CKMBINDEX, TROPONINI in the last 168 hours.  BNP: Invalid input(s): POCBNP  CBG: Recent Labs  Lab 12/16/18 [place]*    Microbiology: Results for orders placed or performed during the hospital encounter of 12/15/18  SARS CORONAVIRUS 2 Nasal Swab Aptima Multi Swab     Status: None   Collection Time: 12/15/18  7:03 PM   Specimen: Aptima Multi Swab;  Nasal Swab  Result Value Ref Range Status   SARS Coronavirus 2 NEGATIVE NEGATIVE Final    Comment: (NOTE) SARS-CoV-2 target nucleic acids are NOT DETECTED. The SARS-CoV-2 RNA is generally detectable in upper and lower respiratory specimens during the acute phase of infection. Negative results do not preclude SARS-CoV-2 infection, do not rule out co-infections with other pathogens, and should not be used as the sole basis for treatment or other patient management decisions. Negative results must be combined with clinical observations, patient history, and epidemiological information. The expected result is Negative. Fact Sheet for Patients: HairSlick.nohttps://www.fda.gov/media/138098/download Fact Sheet for Healthcare Providers: quierodirigir.comhttps://www.fda.gov/media/138095/download This test is not yet approved or cleared by the [place] FDA and  has been authorized for detection and/or diagnosis of SARS-CoV-2 by FDA under an Emergency Use Authorization (EUA). This EUA will remain  in effect (meaning this test can be used) for the duration of the COVID-19 declaration under Section 56 4(b)(1) of the Act, 21 U.S.C. section  360bbb-3(b)(1), unless the authorization is terminated or revoked sooner. Performed at [facility], [address] [place], [place] [address]   MRSA PCR Screening     Status: None   Collection Time: 12/16/18  2:59 PM   Specimen: Nasopharyngeal  Result Value Ref Range Status   MRSA by PCR NEGATIVE NEGATIVE Final    Comment:        The GeneXpert MRSA Assay (FDA approved for NASAL specimens only), is one component of a comprehensive MRSA colonization surveillance program. It is not intended to diagnose MRSA infection nor to guide or monitor treatment for MRSA infections. Performed at [facility], [address] [place], [place] [address]     Coagulation Studies: No results for input(s): LABPROT, INR in the last 72 hours.  Urinalysis: No results for input(s): COLORURINE, LABSPEC, PHURINE, GLUCOSEU, HGBUR, BILIRUBINUR, KETONESUR, PROTEINUR, UROBILINOGEN, NITRITE, LEUKOCYTESUR in the last 72 hours.  Invalid input(s): APPERANCEUR    Imaging: No results found.   Medications:    . amLODipine  2.5 mg Oral Daily  . aspirin EC  81 mg Oral Daily  . calcium-vitamin D  1 tablet Oral BID WC  . clopidogrel  75 mg Oral Daily  . heparin  5,000 Units Subcutaneous Q8H  . lisinopril  40 mg Oral Daily  . pantoprazole  40 mg Oral Daily  . sodium chloride flush  10-40 mL Intracatheter Q12H  . sodium chloride  1 g Oral BID WC  . tolvaptan  15 mg Oral Q24H  . cyanocobalamin  2,000 mcg Oral Daily   acetaminophen, docusate sodium, sodium chloride flush, triazolam  Assessment/ Plan:  78 y.o. female with a PMHx of carotid artery stenosis, depression, GERD, history of CVA, hyperlipidemia, hypertension, insomnia, tobacco abuse, who was admitted to [facility] on 12/15/2018 for evaluation of altered mental status and abnormal labs.   1. Hyponatremia. 2.  Small pulmonary nodule. 3.  Hypertension.  Plan:  Patient seen at bedside today.  In good spirits.  Serum sodium up to 130.   Okay to discontinue tolvaptan at this time.  Continue sodium chloride 1 g p.o. twice daily as well as fluid restriction of 1.2 L.  May need to consider adding furosemide low-dose as an outpatient as well but hold off for now.  Patient okay for discharge from our perspective.  She will need follow-up in our office in 1 to 2 weeks post discharge.  Thanks for allowing us to participate.   LOS: 4 Mirakle Tomlin 8/15/202011:31 AM

## 2018-12-19 NOTE — Progress Notes (Signed)
Pt is being discharged home.  Discharge papers given and explained to pt and spouse. Both verbalized understanding. Meds and f/u up reviewed.  Rx sent electronically to pharmacy. Pt made aware.

## 2019-02-03 ENCOUNTER — Other Ambulatory Visit: Payer: Self-pay

## 2019-02-03 ENCOUNTER — Ambulatory Visit
Admission: EM | Admit: 2019-02-03 | Discharge: 2019-02-03 | Disposition: A | Discharge status: Home / Self Care | Payer: Medicare Other | Attending: Family | Admitting: Family

## 2019-02-03 DIAGNOSIS — F411 Generalized anxiety disorder: Secondary | ICD-10-CM

## 2019-02-03 DIAGNOSIS — R208 Other disturbances of skin sensation: Secondary | ICD-10-CM

## 2019-02-03 DIAGNOSIS — R202 Paresthesia of skin: Secondary | ICD-10-CM

## 2019-02-03 MED ORDER — CITALOPRAM HYDROBROMIDE 10 MG PO TABS: 10.0000 mg | ORAL_TABLET | Freq: Every day | ORAL | 0 refills | Status: AC

## 2019-02-03 NOTE — ED Triage Notes (Signed)
Patient states that she has been feeling like her upper body from the waist up is "stinging, burning" for the last 3 weeks. Reports that she has been looking for a rash but hasnt seen one.

## 2019-02-03 NOTE — Discharge Instructions (Addendum)
Recommend start with increasing Gabapentin to 300mg  3 times a day (take one in AM, one at lunch and then another in PM)- try this for 3 days. If no improvement in symptoms, start Citalopram 10mg  tablet at dinner time. Call your PCP and schedule follow-up in 1 week for recheck. May need to see a Neurologist or have additional testing to determine cause of symptoms. Follow-up with your PCP as planned.

## 2019-02-03 NOTE — ED Provider Notes (Signed)
[facility]    CSN: [id number] Arrival date & time: 02/03/19  [id number]      History   Chief Complaint Chief Complaint  Patient presents with   Generalized Body Aches    HPI Ruth BimlerMary Ahren Pettinger Ramirez is a 78 y.o. female.   78 year old female presents with tingling sensation and "burning" irritation of her skin from her waist up for the past 3 weeks. No distinct itching. No rash. Has been seen a few times by her PCP for this concern- patient had previously indicated she had some itching so moisturizing lotion and Hydroxyzine have been tried with no relief. Her PCP had referred her to a Dermatologist since her symptoms continue but she has not made an appointment yet. She indicates that clothes "irritate her skin" and she has a constant "burning feeling" to her skin in her torso, upper arms and neck. She is worried she has the beginnings of diabetes or that her sodium is low again and causing these symptoms. Denies any fever, chills, cough, chest pain, numbness or weakness. Her PCP did lab work about 2 weeks ago which was all essentially normal. She did admit that she stopped taking her Zoloft about 3 weeks ago and is asking about restarting Citalopram (Celexa) that she was on in the past which seemed to work better for anxiety and sleep. She was also placed on Gabapentin for a "restless leg" issue in 2018 and only takes it twice a day. Other chronic health issues include HTN, history of CVA, GERD, anxiety,and insomnia and currently on Norvasc, Lisinopril, Plavix, Prevacid, aspirin, Neurontin and Halcion daily.    The history is provided by the patient.    Past Medical History:  Diagnosis Date   Carotid artery stenosis    Depression    GERD (gastroesophageal reflux disease)    History of CVA (cerebrovascular accident)    Hyperlipidemia    Hypertension    Insomnia     Patient Active Problem List   Diagnosis Date Noted   Hyponatremia 12/15/2018    Past Surgical History:    Procedure Laterality Date   ABDOMINAL HYSTERECTOMY  1977   BACK SURGERY     CAROTID ARTERY ANGIOPLASTY Left 2015   SHOULDER SURGERY Right 1997    OB History   No obstetric history on file.      Home Medications    Prior to Admission medications   Medication Sig Start Date End Date Taking? Authorizing Provider  amLODipine (NORVASC) 2.5 MG tablet Take 2.5 mg by mouth daily.   Yes [provider]  aspirin 81 MG tablet Take 81 mg by mouth daily.   Yes [provider]  calcium carbonate (OS-CAL) 600 MG TABS tablet Take 600 mg by mouth 2 (two) times daily with a meal.   Yes [provider]  clopidogrel (PLAVIX) 75 MG tablet Take 75 mg by mouth daily.   Yes [provider]  cyanocobalamin ([employer] VITAMIN B12) 2000 MCG tablet Take by mouth.   Yes [provider]  gabapentin (NEURONTIN) 300 MG capsule Take 1 capsule (300 mg total) by mouth 2 (two) times daily. 12/19/18  Yes Wieting, Richard, MD  lansoprazole (PREVACID) 30 MG capsule Take 30 mg by mouth daily at 12 noon.   Yes [provider]  lisinopril (PRINIVIL,ZESTRIL) 40 MG tablet Take 40 mg by mouth daily.   Yes [provider]  triazolam (HALCION) 0.125 MG tablet Take 0.125 mg by mouth at bedtime as needed for sleep. May  take 1/2 extra as needed   Yes [provider]  citalopram (CELEXA) 10 MG tablet Take 1 tablet (10 mg total) by mouth daily. 02/03/19   Sudie Grumbling, NP    Family History Family History  Problem Relation Age of Onset   Uterine cancer Mother    Lung cancer Father     Social History Social History   Tobacco Use   Smoking status: Current Every Day Smoker    Packs/day: 0.50    Years: 40.00    Pack years: 20.00    Types: Cigarettes   Smokeless tobacco: Never Used  Substance Use Topics   Alcohol use: No    Alcohol/week: 0.0 standard drinks   Drug use: No     Allergies   Morphine and related   Review of Systems Review of  Systems  Constitutional: Negative for activity change, appetite change, chills, fatigue and fever.  HENT: Negative for congestion, facial swelling, mouth sores, nosebleeds, postnasal drip, rhinorrhea and sore throat.   Eyes: Negative for pain, discharge, redness and itching.  Respiratory: Negative for cough, chest tightness, shortness of breath and wheezing.   Cardiovascular: Negative for chest pain and palpitations.  Gastrointestinal: Negative for nausea and vomiting.  Genitourinary: Negative for decreased urine volume, difficulty urinating, dysuria, flank pain and hematuria.  Musculoskeletal: Negative for back pain, neck pain and neck stiffness.  Skin: Negative for color change, rash and wound.       Skin irritation  Neurological: Negative for dizziness, tremors, seizures, syncope, weakness, light-headedness, numbness and headaches.  Hematological: Negative for adenopathy. Bruises/bleeds easily.  Psychiatric/Behavioral: Positive for sleep disturbance. The patient is nervous/anxious.      Physical Exam Triage Vital Signs ED Triage Vitals  Enc Vitals Group     BP 02/03/19 0832 129/84     Pulse Rate 02/03/19 0832 84     Resp 02/03/19 0832 16     Temp 02/03/19 0832 98.2 F (36.8 C)     Temp Source 02/03/19 0832 Oral     SpO2 02/03/19 0832 99 %     Weight 02/03/19 0830 120 lb (54.4 kg)     Height 02/03/19 0830  (1.575 m)     Head Circumference --      Peak Flow --      Pain Score 02/03/19 0829 8     Pain Loc --      Pain Edu? --      Excl. in GC? --    No data found.  Updated Vital Signs BP 129/84 (BP Location: Left Arm)    Pulse 84    Temp 98.2 F (36.8 C) (Oral)    Resp 16    Ht  (1.575 m)    Wt 120 lb (54.4 kg)    SpO2 99%    BMI 21.95 kg/m   Visual Acuity Right Eye Distance:   Left Eye Distance:   Bilateral Distance:    Right Eye Near:   Left Eye Near:    Bilateral Near:     Physical Exam Vitals signs and nursing note reviewed.  Constitutional:       General: She is awake. She is not in acute distress.    Appearance: Normal appearance. She is well-developed, well-groomed and normal weight. She is not ill-appearing.     Comments: Patient sitting comfortably on exam table in no acute distress. Does not appear to be scratching at skin.   HENT:     Head: Normocephalic and atraumatic.  Right Ear: External ear normal.     Left Ear: External ear normal.     Nose: Nose normal.     Mouth/Throat:     Lips: Pink.     Mouth: Mucous membranes are moist.     Pharynx: Oropharynx is clear.  Eyes:     Extraocular Movements: Extraocular movements intact.     Conjunctiva/sclera: Conjunctivae normal.  Neck:     Musculoskeletal: Normal range of motion.  Cardiovascular:     Rate and Rhythm: Normal rate and regular rhythm.     Heart sounds: Normal heart sounds. No murmur.  Pulmonary:     Effort: Pulmonary effort is normal. No respiratory distress.     Breath sounds: Normal breath sounds and air entry. No decreased air movement. No decreased breath sounds, wheezing or rhonchi.  Abdominal:     General: There is no distension.     Palpations: Abdomen is soft.     Tenderness: There is no abdominal tenderness.  Musculoskeletal: Normal range of motion.  Skin:    General: Skin is warm and dry.     Capillary Refill: Capillary refill takes less than 2 seconds.     Coloration: Skin is not cyanotic, jaundiced, mottled or pale.     Findings: No abrasion, abscess, burn, ecchymosis, erythema, signs of injury, lesion, petechiae, rash or wound.     Comments: Patient indicates that touching her skin from her umbilicus to her lower neck and from lower back to neck is slightly painful. No rash, redness or bruising present. No distinct skin abnormality present. Good distal pulses and capillary refill. No neuro deficit noted.   Neurological:     General: No focal deficit present.     Mental Status: She is alert and oriented to person, place, and time.     Sensory:  Sensation is intact. No sensory deficit.     Motor: Motor function is intact.  Psychiatric:        Attention and Perception: Attention normal.        Mood and Affect: Mood and affect normal.        Speech: Speech normal.        Behavior: Behavior normal. Behavior is cooperative.        Thought Content: Thought content normal.        Cognition and Memory: Memory is impaired.      UC Treatments / Results  Labs (all labs ordered are listed, but only abnormal results are displayed) Labs Reviewed - No data to display  EKG   Radiology No results found.  Procedures Procedures (including critical care time)  Medications Ordered in UC Medications - No data to display  Initial Impression / Assessment and Plan / UC Course  I have reviewed the triage vital signs and the nursing notes.  Pertinent labs & imaging results that were available during my care of the patient were reviewed by me and considered in my medical decision making (see chart for details).    Discussed with patient for over 30 minutes, potential causes of her symptoms. Reviewed that her sodium level has remained stable for the past few weeks and that her glucose level and Hgb Alc are normal. Reviewed that elevated liver enzymes and abnormal kidney function could cause itching sensation but again, those recent labs are normal. Discussed low possibility of Dermatologic cause for her symptoms. Reviewed that changes in her medication (stopping Zoloft) or possible Neurological paresthesia cause of recent symptoms. Suggested that she first try increasing  her Neurontin to 3 times a day for the next 3 days. If she does not notice any improvement, she may try restarting her Celexa since this medication seemed to work better for her in the past and I can not find in her extensive medical records that she had a reaction or a specific reason for stopping Celexa a year or two ago. May start with Celexa 10mg  daily and may need to increase  depending upon response. Recommend call her PCP to schedule appointment for recheck in 1 week- if no improvement in symptoms, may need Neurology consult.  Final Clinical Impressions(s) / UC Diagnoses   Final diagnoses:  Tingling of upper extremity  Burning sensation of skin  Generalized anxiety disorder     Discharge Instructions     Recommend start with increasing Gabapentin to 300mg  3 times a day (take one in AM, one at lunch and then another in PM)- try this for 3 days. If no improvement in symptoms, start Citalopram 10mg  tablet at dinner time. Call your PCP and schedule follow-up in 1 week for recheck. May need to see a Neurologist or have additional testing to determine cause of symptoms. Follow-up with your PCP as planned.     ED Prescriptions    Medication Sig Dispense Auth. Provider   citalopram (CELEXA) 10 MG tablet Take 1 tablet (10 mg total) by mouth daily. 30 tablet Talayeh Bruinsma, , NP     PDMP not reviewed this encounter.   , NP 02/03/19 1950

## 2019-02-11 ENCOUNTER — Other Ambulatory Visit: Payer: Self-pay | Admitting: Oncology

## 2019-02-11 DIAGNOSIS — R911 Solitary pulmonary nodule: Secondary | ICD-10-CM

## 2019-02-11 NOTE — Progress Notes (Signed)
  Pulmonary Nodule Clinic Telephone Note  Received referral from Dr. Patsey Berthold and Dr. Ellison Hughs..   Patient was evaluated at [facility] emergency room for headache. abnormal labs and altered mental status.  Work-up included labs indicating hyponatremia and hypo-osmolality and CT chest revealing a 6 mm pulmonary nodule in right upper lobe.  It was recommended she have a repeat noncontrast CT in approximately 6 to 12 months.    I personally reviewed all patient's previous imaging including a chest x-ray from 12/15/2018, 12/19/2017 and 05/22/2014 which did not reveal any architectural distortion or acute abnormality.  No other imaging available for my review.  I recommend follow-up with noncontrasted CT scan of the chest in approximately 6-9 months.   Patient is a current everyday smoker and trying to quit.   High risk factors include: History of heavy smoking, exposure to asbestos, radium or uranium, personal family history of lung cancer, older age, sex (females greater than males), race (black and native [place] greater than weight), marginal speculation, upper lobe location, multiplicity (less than 5 nodules increases risk for malignancy) and emphysema and/or pulmonary fibrosis.   This recommendation follows the consensus statement: Guidelines for Management of Incidental Pulmonary Nodules Detected on CT Images: From the Fleischner Society 2017; Radiology 2017; 284:228-243.    I have placed order for CT scan without contrast to be completed approximately 6 from previous CT scan.    I will touch base with patient and schedule him/her virtually for results of the CT scan and recommendations per Fleischner's guidelines and our pulmonary nodule clinic.  Faythe Casa, NP 02/11/2019 12:24 PM

## 2019-02-17 ENCOUNTER — Telehealth: Payer: Self-pay | Admitting: *Deleted

## 2019-02-17 NOTE — Telephone Encounter (Signed)
Spoke with patient regarding referral to lung nodule clinic to follow up lung nodule found on CT in Feb 2020. Pt stated that her insurance is not accepted at [facility] and only accepted at [facility]. She has requested that her follow up CT be scheduled with a [facility] facility. Informed pt that will notify her PCP, Dr. Ellison Hughs since he is a [facility] provider to schedule further workup. Contact info given to pt and instructed to call back if has any further questions or needs. Pt verbalized understanding.

## 2019-06-09 ENCOUNTER — Other Ambulatory Visit: Payer: Self-pay | Admitting: Family Medicine

## 2019-06-09 DIAGNOSIS — R911 Solitary pulmonary nodule: Secondary | ICD-10-CM

## 2019-06-17 ENCOUNTER — Ambulatory Visit
Admission: RE | Admit: 2019-06-17 | Discharge: 2019-06-17 | Disposition: A | Discharge status: Home / Self Care | Payer: Medicare Other | Source: Ambulatory Visit | Attending: Family Medicine | Admitting: Family Medicine

## 2019-06-17 ENCOUNTER — Encounter (INDEPENDENT_AMBULATORY_CARE_PROVIDER_SITE_OTHER): Payer: Self-pay

## 2019-06-17 ENCOUNTER — Other Ambulatory Visit: Payer: Self-pay

## 2019-06-17 DIAGNOSIS — R911 Solitary pulmonary nodule: Secondary | ICD-10-CM | POA: Diagnosis present

## 2019-06-17 MED ORDER — IOHEXOL 300 MG/ML  SOLN
75.0000 mL | Freq: Once | INTRAMUSCULAR | Status: AC | PRN
  Administered 2019-06-17: 12:00:00 75 mL via INTRAVENOUS

## 2019-06-22 ENCOUNTER — Other Ambulatory Visit: Payer: Commercial Managed Care - PPO

## 2020-05-31 ENCOUNTER — Other Ambulatory Visit: Payer: Self-pay | Admitting: Family Medicine

## 2020-05-31 ENCOUNTER — Other Ambulatory Visit: Payer: Self-pay | Admitting: *Deleted

## 2020-05-31 DIAGNOSIS — J439 Emphysema, unspecified: Secondary | ICD-10-CM

## 2020-05-31 DIAGNOSIS — R911 Solitary pulmonary nodule: Secondary | ICD-10-CM

## 2020-06-07 ENCOUNTER — Ambulatory Visit: Payer: Medicare Other

## 2020-06-14 ENCOUNTER — Ambulatory Visit: Admission: RE | Admit: 2020-06-14 | Payer: Medicare Other | Source: Ambulatory Visit

## 2020-07-18 IMAGING — CT CT CHEST W/ CM
1 series · 15 of 34 positions shown, 19 images · IV contrast (omnipaque)
Comparison: 12/16/2018

CLINICAL DATA: Pulmonary nodule, hypertension

EXAM:
CT CHEST WITH CONTRAST
TECHNIQUE: Multidetector CT imaging of the chest was performed during
intravenous contrast administration.
CONTRAST:  75mL OMNIPAQUE IOHEXOL 300 MG/ML  SOLN

[Series 2: axial st · axial · 0.61mm/px · z∈[-880,-606]mm · 15 of 161 slices shown, 19 images]
[im 12/161  mediastinal]
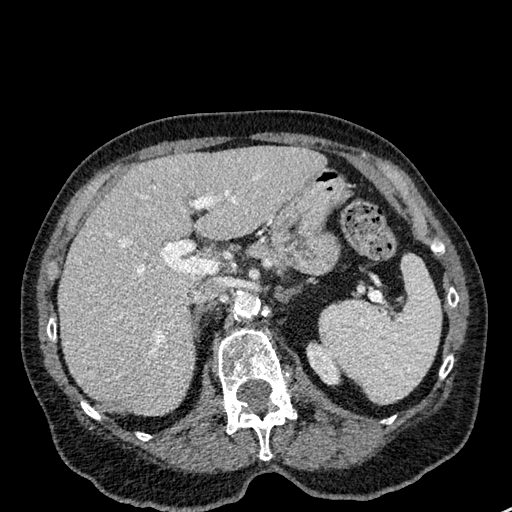
[im 12/161  lung]
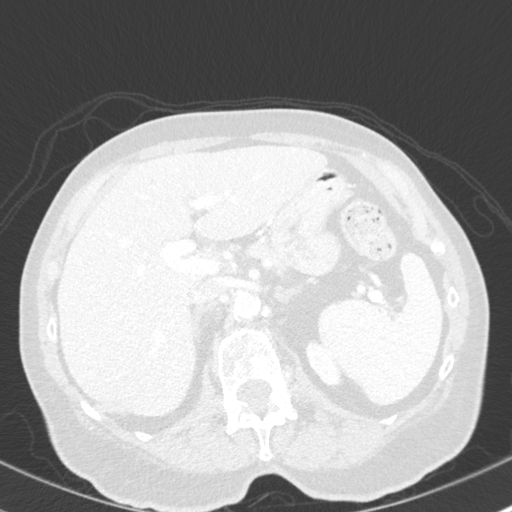
[im 24/161  lung]
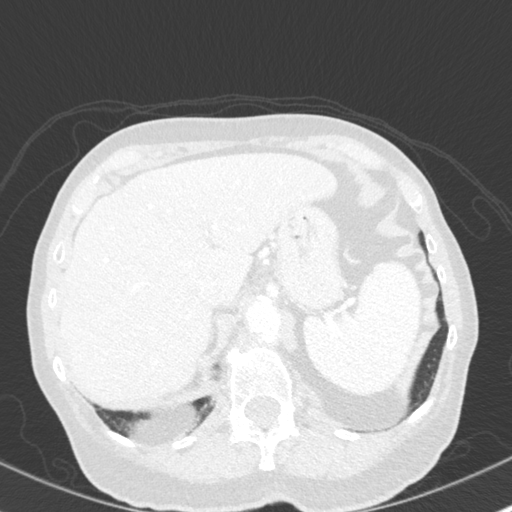
[im 33/161  lung]
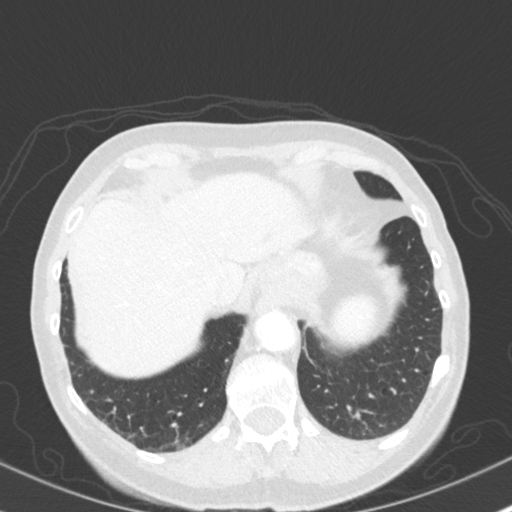
[im 42/161  lung]
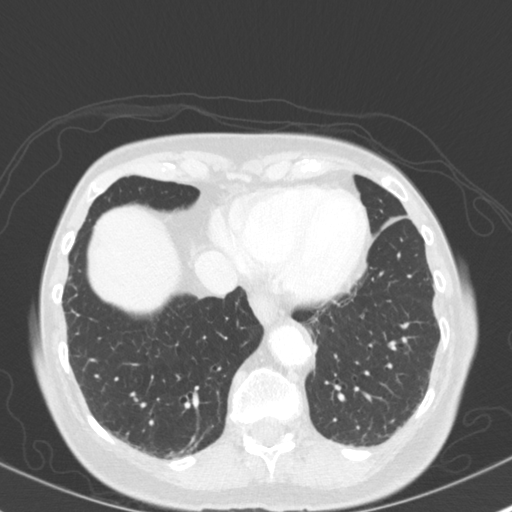
[im 54/161  mediastinal]
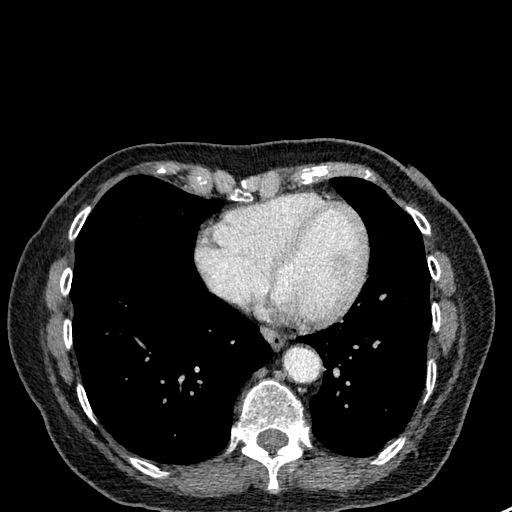
[im 54/161  lung]
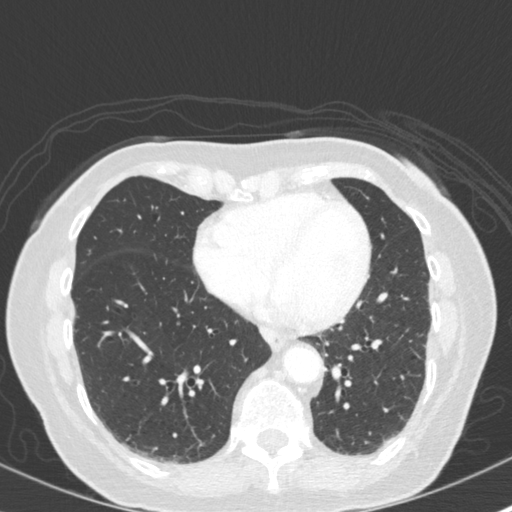
[im 65/161  lung]
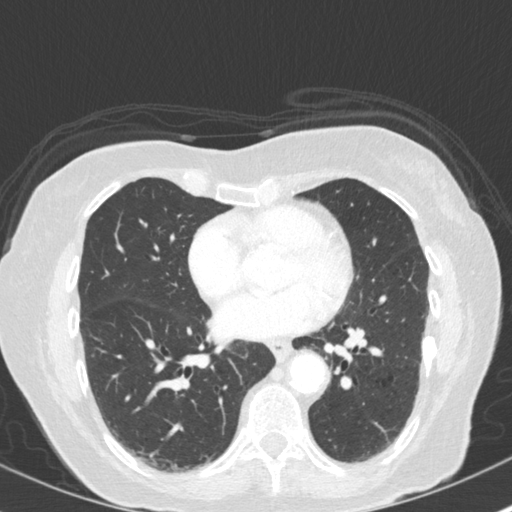
[im 72/161  lung]
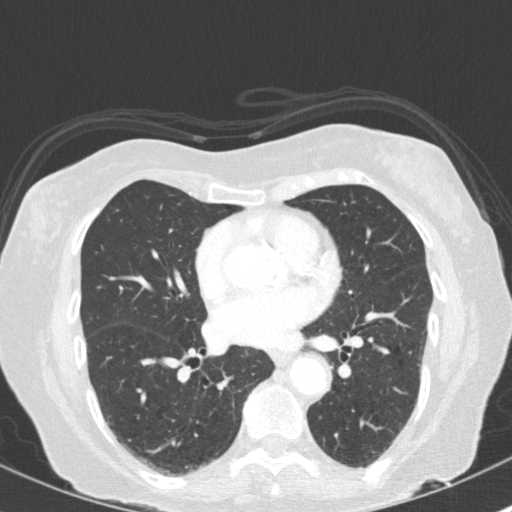
[im 83/161  lung]
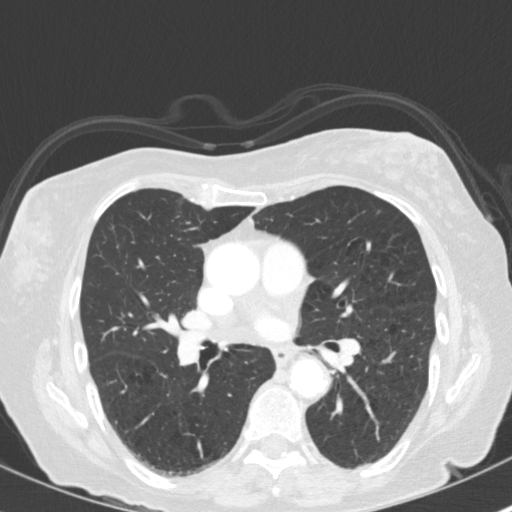
[im 89/161  mediastinal]
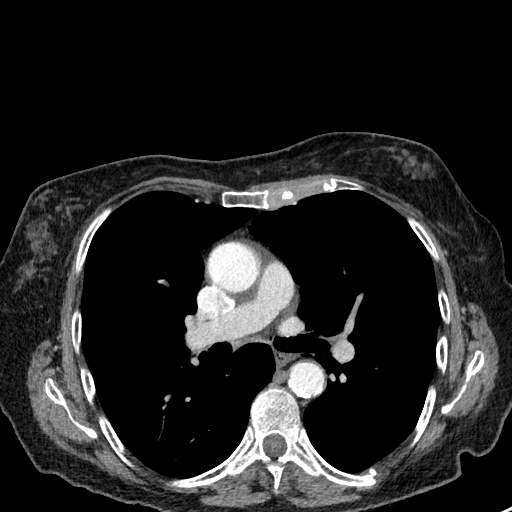
[im 89/161  lung]
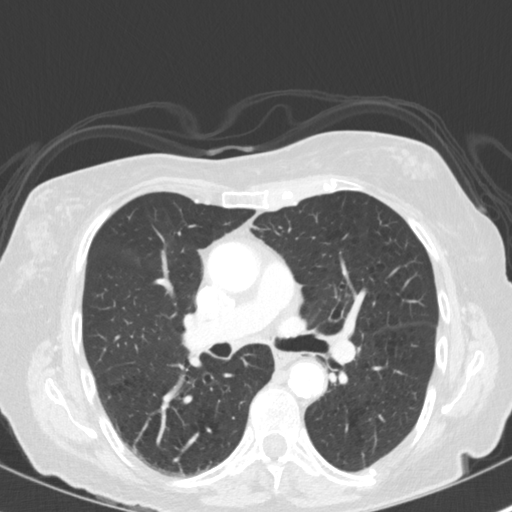
[im 97/161  lung]
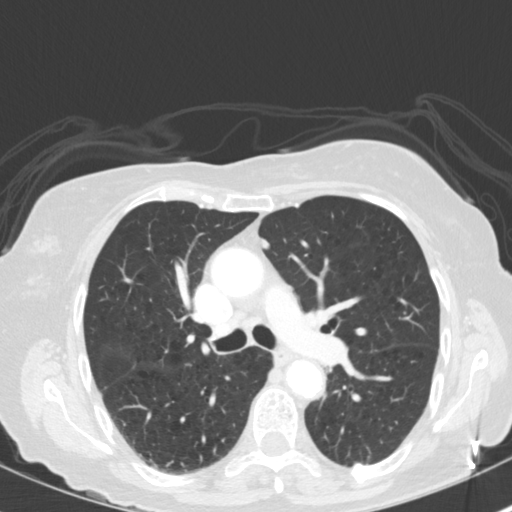
[im 107/161  lung]
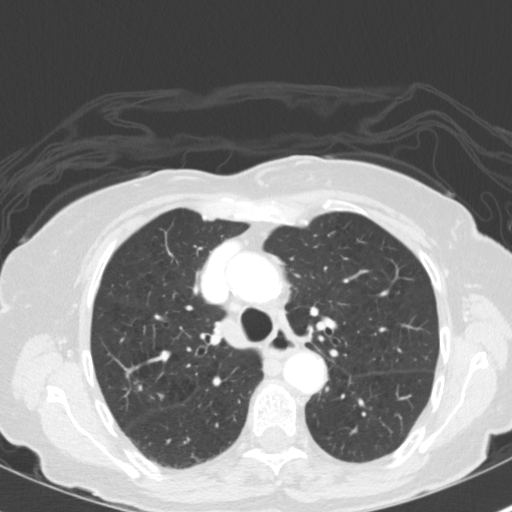
[im 119/161  lung]
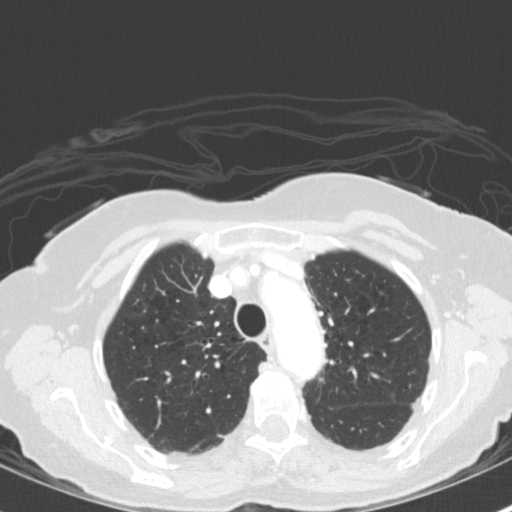
[im 129/161  mediastinal]
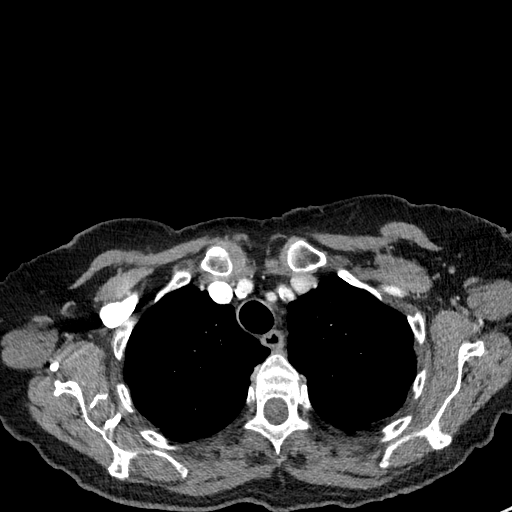
[im 129/161  lung]
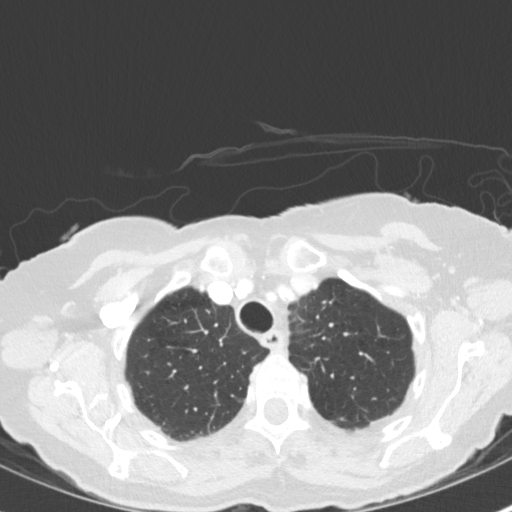
[im 137/161  lung]
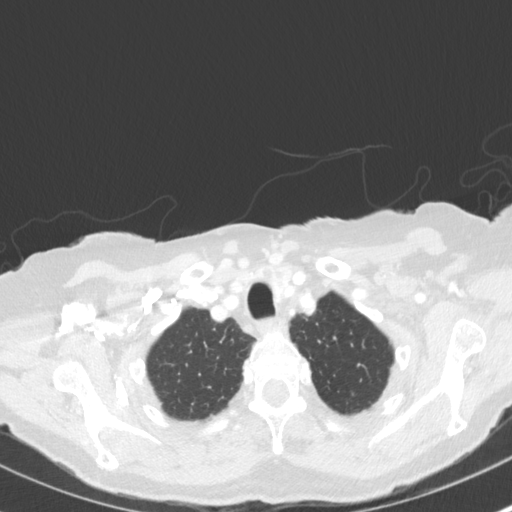
[im 149/161  lung]
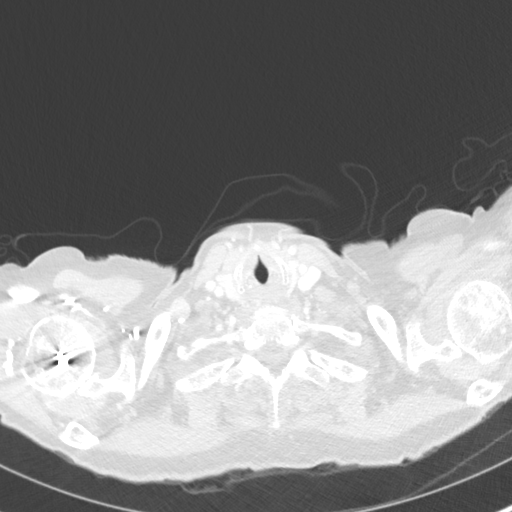

[15 of 34 positions shown; findings below may reference images not displayed]

FINDINGS: Cardiovascular: The heart is unremarkable without pericardial
effusion. Moderate atherosclerosis within the coronary vasculature
and aortic arch, stable.

Mediastinum/Nodes: Stable subcentimeter nodule is seen within the
bilateral thyroid. Largest on the left measures 9 mm unchanged. No
pathologic mediastinal or hilar adenopathy.

Lungs/Pleura: There is a 6 mm area of nodular pleural thickening
within the minor fissure, reference axial image 77 of series 2. No
other pulmonary nodules or masses.

No airspace disease, effusion, or pneumothorax. Diffuse emphysema
unchanged. Central airways are patent.

Upper Abdomen: No acute abnormality.

Musculoskeletal: There are no acute displaced fractures. Chronic
nonunion left humeral neck fracture is noted. There are multiple
chronic left-sided rib fractures unchanged. Reconstructed images
demonstrate no additional findings.
IMPRESSION: 1. Stable appearance of nodular pleural thickening within the minor
fissure, measuring 6 mm.
2. No other pulmonary nodules or masses.
3. Aortic Atherosclerosis (H60AQ-L0X.X).
4. Emphysema (H60AQ-6RV.E).

## 2021-01-09 ENCOUNTER — Ambulatory Visit
Admission: EM | Admit: 2021-01-09 | Discharge: 2021-01-09 | Disposition: A | Discharge status: Home / Self Care | Payer: Medicare Other | Attending: Family | Admitting: Family

## 2021-01-09 ENCOUNTER — Other Ambulatory Visit: Payer: Self-pay

## 2021-01-09 DIAGNOSIS — R059 Cough, unspecified: Secondary | ICD-10-CM | POA: Insufficient documentation

## 2021-01-09 DIAGNOSIS — Z20822 Contact with and (suspected) exposure to covid-19: Secondary | ICD-10-CM | POA: Insufficient documentation

## 2021-01-09 DIAGNOSIS — J0140 Acute pansinusitis, unspecified: Secondary | ICD-10-CM | POA: Diagnosis present

## 2021-01-09 DIAGNOSIS — J3489 Other specified disorders of nose and nasal sinuses: Secondary | ICD-10-CM | POA: Insufficient documentation

## 2021-01-09 HISTORY — DX: Cerebral infarction, unspecified: I63.9

## 2021-01-09 MED ORDER — BENZONATATE 200 MG PO CAPS: 200.0000 mg | ORAL_CAPSULE | Freq: Three times a day (TID) | ORAL | 0 refills | Status: AC | PRN

## 2021-01-09 MED ORDER — AMOXICILLIN 875 MG PO TABS: 875.0000 mg | ORAL_TABLET | Freq: Two times a day (BID) | ORAL | 0 refills | Status: AC

## 2021-01-09 NOTE — ED Provider Notes (Signed)
[facility]    CSN: [id number] Arrival date & time: 01/09/21  1548      History   Chief Complaint Chief Complaint  Patient presents with   Cough   Nasal Congestion    HPI Ruth Ramirez is a 80 y.o. female.   80 year old female presents with nasal congestion and cough for over 5 days.  Had a slight fever at first and irritated throat but now having more sinus pressure and chest hurts from coughing.  Feels slightly nauseous and has gagged from coughing but no distinct vomiting or diarrhea.  Husband and grandson also recently ill but negative for COVID-19.  She has been vaccinated against COVID-19 but no booster.  She has tried Robitussin DM and Alka-Seltzer with minimal relief.  Other chronic health issues include HTN, hyperlipidemia, history of CVA, GERD, neuropathy, and insomnia.  Currently on Lisinopril, Aldactone, Plavix, aspirin, Prevacid, Lyrica, Celexa, Teriparatide, and Halcion daily.   The history is provided by the patient.   Past Medical History:  Diagnosis Date   Carotid artery stenosis    Depression    GERD (gastroesophageal reflux disease)    History of CVA (cerebrovascular accident)    Hyperlipidemia    Hypertension    Insomnia    Stroke [facility](HCC)     Patient Active Problem List   Diagnosis Date Noted   Hyponatremia 12/15/2018    Past Surgical History:  Procedure Laterality Date   ABDOMINAL HYSTERECTOMY  1977   BACK SURGERY     CAROTID ARTERY ANGIOPLASTY Left 2015   SHOULDER SURGERY Right 1997    OB History   No obstetric history on file.      Home Medications    Prior to Admission medications   Medication Sig Start Date End Date Taking? Authorizing Provider  amoxicillin (AMOXIL) 875 MG tablet Take 1 tablet (875 mg total) by mouth 2 (two) times daily for 7 days. 01/09/21 01/16/21 Yes Annison Birchard, Ali LoweAnn Berry, NP  aspirin 81 MG tablet Take 81 mg by mouth daily.   Yes [provider]  benzonatate (TESSALON) 200 MG capsule Take 1 capsule  (200 mg total) by mouth 3 (three) times daily as needed for cough. 01/09/21  Yes Sears Oran, Ali LoweAnn Berry, NP  citalopram (CELEXA) 10 MG tablet Take 1 tablet (10 mg total) by mouth daily. 02/03/19  Yes Avrum Kimball, Ali LoweAnn Berry, NP  clopidogrel (PLAVIX) 75 MG tablet Take 75 mg by mouth daily.   Yes [provider]  cyanocobalamin 2000 MCG tablet Take by mouth.   Yes [provider]  lansoprazole (PREVACID) 30 MG capsule Take 30 mg by mouth daily at 12 noon.   Yes [provider]  lisinopril (PRINIVIL,ZESTRIL) 40 MG tablet Take 40 mg by mouth daily.   Yes [provider]  pregabalin (LYRICA) 100 MG capsule Take 1 capsule by mouth 3 (three) times daily. 11/21/20 11/21/21 Yes [provider]  spironolactone (ALDACTONE) 50 MG tablet Take 50 mg by mouth daily. 12/26/20  Yes [provider]  triazolam (HALCION) 0.125 MG tablet Take 0.125 mg by mouth at bedtime as needed for sleep. May take 1/2 extra as needed   Yes [provider]  calcium carbonate (OS-CAL) 600 MG TABS tablet Take 600 mg by mouth 2 (two) times daily with a meal.    [provider]  Teriparatide, Recombinant, 600 MCG/2.4ML SOPN Inject into the skin. 06/14/20   [provider]    Family History Family History  Problem Relation Age of  Onset   Uterine cancer Mother    Lung cancer Father     Social History Social History   Tobacco Use   Smoking status: Every Day    Packs/day: 0.50    Years: 40.00    Pack years: 20.00    Types: Cigarettes   Smokeless tobacco: Never  Vaping Use   Vaping Use: Never used  Substance Use Topics   Alcohol use: No    Alcohol/week: 0.0 standard drinks   Drug use: No     Allergies   Morphine and related   Review of Systems Review of Systems  Constitutional:  Positive for appetite change, fatigue and fever. Negative for chills and diaphoresis.  HENT:  Positive for congestion, facial swelling, postnasal drip, sinus pressure, sinus pain  and sore throat. Negative for ear discharge, ear pain, mouth sores, nosebleeds, sneezing, tinnitus and trouble swallowing.   Eyes:  Positive for pain. Negative for discharge and itching.  Respiratory:  Positive for cough. Negative for chest tightness, shortness of breath and wheezing.   Gastrointestinal:  Positive for nausea. Negative for diarrhea and vomiting.  Musculoskeletal:  Positive for arthralgias and myalgias. Negative for neck pain and neck stiffness.  Skin:  Negative for color change and rash.  Allergic/Immunologic: Negative for environmental allergies and food allergies.  Neurological:  Positive for light-headedness and headaches. Negative for dizziness, tremors, seizures, syncope, facial asymmetry, speech difficulty and weakness.  Hematological:  Negative for adenopathy. Bruises/bleeds easily.  Psychiatric/Behavioral:  Positive for sleep disturbance.     Physical Exam Triage Vital Signs ED Triage Vitals  Enc Vitals Group     BP 01/09/21 1635 (!) 158/71     Pulse Rate 01/09/21 1635 61     Resp 01/09/21 1635 18     Temp 01/09/21 1635 98 F (36.7 C)     Temp Source 01/09/21 1635 Oral     SpO2 01/09/21 1635 99 %     Weight 01/09/21 1631 128 lb (58.1 kg)     Height 01/09/21 1631 5\' 1"  (1.549 m)     Head Circumference --      Peak Flow --      Pain Score 01/09/21 1631 0     Pain Loc --      Pain Edu? --      Excl. in GC? --    No data found.  Updated Vital Signs BP (!) 158/71 (BP Location: Left Arm)   Pulse 61   Temp 98 F (36.7 C) (Oral)   Resp 18   Ht 5\' 1"  (1.549 m)   Wt 128 lb (58.1 kg)   SpO2 99%   BMI 24.19 kg/m   Visual Acuity Right Eye Distance:   Left Eye Distance:   Bilateral Distance:    Right Eye Near:   Left Eye Near:    Bilateral Near:     Physical Exam Vitals and nursing note reviewed.  Constitutional:      General: She is awake. She is not in acute distress.    Appearance: She is well-developed. She is ill-appearing.     Comments: She  is sitting in the exam chair in no acute distress but appears ill and tired.   HENT:     Head: Normocephalic and atraumatic.     Right Ear: Hearing, ear canal and external ear normal. No drainage or tenderness. Tympanic membrane is bulging. Tympanic membrane is not injected or erythematous.     Left Ear: Hearing, ear canal and external ear normal.  No drainage or tenderness. Tympanic membrane is bulging. Tympanic membrane is not injected or erythematous.     Nose: Mucosal edema and congestion present.     Right Sinus: Maxillary sinus tenderness and frontal sinus tenderness present.     Left Sinus: Maxillary sinus tenderness and frontal sinus tenderness present.     Comments: Ethmoid tenderness    Mouth/Throat:     Lips: Pink.     Mouth: Mucous membranes are moist.     Pharynx: Uvula midline. Posterior oropharyngeal erythema present.  Eyes:     General: Lids are normal.     Extraocular Movements: Extraocular movements intact.     Conjunctiva/sclera:     Right eye: Right conjunctiva is injected.     Left eye: Left conjunctiva is injected.     Comments: Mildly injected bilaterally  Cardiovascular:     Rate and Rhythm: Normal rate and regular rhythm.     Heart sounds: Normal heart sounds. No murmur heard. Pulmonary:     Effort: Pulmonary effort is normal. No accessory muscle usage, prolonged expiration or respiratory distress.     Breath sounds: Normal air entry. No decreased air movement. Examination of the right-upper field reveals decreased breath sounds. Examination of the left-upper field reveals decreased breath sounds. Decreased breath sounds present. No wheezing, rhonchi or rales.     Comments: Slightly decreased breath sounds in upper lobes. No coarse breath sounds heard.  Musculoskeletal:     Cervical back: Normal range of motion and neck supple.  Lymphadenopathy:     Cervical: No cervical adenopathy.  Skin:    General: Skin is warm and dry.     Capillary Refill: Capillary  refill takes less than 2 seconds.     Findings: No rash.  Neurological:     General: No focal deficit present.     Mental Status: She is alert and oriented to person, place, and time.  Psychiatric:        Mood and Affect: Mood normal.        Behavior: Behavior normal. Behavior is cooperative.        Thought Content: Thought content normal.        Judgment: Judgment normal.     UC Treatments / Results  Labs (all labs ordered are listed, but only abnormal results are displayed) Labs Reviewed  SARS CORONAVIRUS 2 (TAT 6-24 HRS)    EKG   Radiology No results found.  Procedures Procedures (including critical care time)  Medications Ordered in UC Medications - No data to display  Initial Impression / Assessment and Plan / UC Course  I have reviewed the triage vital signs and the nursing notes.  Pertinent labs & imaging results that were available during my care of the patient were reviewed by me and considered in my medical decision making (see chart for details).    Reviewed with patient that she appears to have a sinus infection with drainage causing her cough. May be viral but due to worsening of symptoms and more facial swelling due to sinus pressure, will treat for possible bacterial infection. May start Amoxicillin 875mg  twice a day as directed. May use Tessalon cough pills 1 every 8 hours as needed. May also continue OTC Robitussin as needed.  Continue to push fluids to stay well-hydrated and to help loosen up mucus in sinuses and chest.  Rest.  Stay at home.  Follow-up pending COVID-19 test results and with her PCP in 3 to 4 days if not improving. Final Clinical  Impressions(s) / UC Diagnoses   Final diagnoses:  Acute non-recurrent pansinusitis  Sinus pressure  Cough     Discharge Instructions      Recommend start Amoxicillin 875mg  twice a day as directed. Take Tessalon cough pills 1 every 8 hours as needed for cough. May also continue Robitussin as needed for  cough. Continue to push fluids to stay well hydrated and to help loosen up mucus in sinuses and chest. Rest. Stay at home. Follow-up pending COVID 19 test results and with your PCP in 3 to 4 days if not improving.      ED Prescriptions     Medication Sig Dispense Auth. Provider   amoxicillin (AMOXIL) 875 MG tablet Take 1 tablet (875 mg total) by mouth 2 (two) times daily for 7 days. 14 tablet Tanise Russman, , NP   benzonatate (TESSALON) 200 MG capsule Take 1 capsule (200 mg total) by mouth 3 (three) times daily as needed for cough. 21 capsule Khayla Koppenhaver, Ali Lowe, NP      PDMP not reviewed this encounter.   Ali Lowe, NP 01/10/21 1011

## 2021-01-09 NOTE — Discharge Instructions (Addendum)
Recommend start Amoxicillin 875mg  twice a day as directed. Take Tessalon cough pills 1 every 8 hours as needed for cough. May also continue Robitussin as needed for cough. Continue to push fluids to stay well hydrated and to help loosen up mucus in sinuses and chest. Rest. Stay at home. Follow-up pending COVID 19 test results and with your PCP in 3 to 4 days if not improving.

## 2021-01-09 NOTE — ED Triage Notes (Signed)
Pt c/o cough and sinus congestion for several days. Pt denies f/n/v/d or other symptoms. Pt states her belly is sore from coughing so much.

## 2021-01-10 LAB — SARS CORONAVIRUS 2 (TAT 6-24 HRS): SARS Coronavirus 2: NEGATIVE

## 2021-09-18 ENCOUNTER — Other Ambulatory Visit: Payer: Self-pay | Admitting: Family Medicine

## 2021-09-18 DIAGNOSIS — R911 Solitary pulmonary nodule: Secondary | ICD-10-CM

## 2021-12-05 ENCOUNTER — Other Ambulatory Visit: Payer: Self-pay | Admitting: Nephrology

## 2021-12-05 DIAGNOSIS — N1832 Chronic kidney disease, stage 3b: Secondary | ICD-10-CM

## 2021-12-13 ENCOUNTER — Ambulatory Visit
Admission: RE | Admit: 2021-12-13 | Discharge: 2021-12-13 | Disposition: A | Discharge status: Home / Self Care | Payer: Medicare Other | Source: Ambulatory Visit | Attending: Nephrology | Admitting: Nephrology

## 2021-12-13 DIAGNOSIS — N1832 Chronic kidney disease, stage 3b: Secondary | ICD-10-CM | POA: Diagnosis present

## 2023-04-18 ENCOUNTER — Other Ambulatory Visit: Payer: Self-pay | Admitting: Family Medicine

## 2023-04-18 DIAGNOSIS — I10 Essential (primary) hypertension: Secondary | ICD-10-CM

## 2023-04-18 DIAGNOSIS — R911 Solitary pulmonary nodule: Secondary | ICD-10-CM
